# Patient Record
Sex: Female | Born: 1975 | Race: White | Hispanic: No | Marital: Married | Smoking: Current some day smoker
Health system: Southern US, Community
[De-identification: ages and names within clinical notes are randomized; demographics above are authoritative.]

## PROBLEM LIST (undated history)

## (undated) DIAGNOSIS — Z720 Tobacco use: Secondary | ICD-10-CM

## (undated) DIAGNOSIS — E669 Obesity, unspecified: Secondary | ICD-10-CM

## (undated) DIAGNOSIS — K589 Irritable bowel syndrome without diarrhea: Secondary | ICD-10-CM

## (undated) DIAGNOSIS — I10 Essential (primary) hypertension: Secondary | ICD-10-CM

## (undated) DIAGNOSIS — I214 Non-ST elevation (NSTEMI) myocardial infarction: Secondary | ICD-10-CM

## (undated) DIAGNOSIS — I5181 Takotsubo syndrome: Secondary | ICD-10-CM

## (undated) DIAGNOSIS — R519 Headache, unspecified: Secondary | ICD-10-CM

## (undated) DIAGNOSIS — N920 Excessive and frequent menstruation with regular cycle: Secondary | ICD-10-CM

## (undated) DIAGNOSIS — R51 Headache: Secondary | ICD-10-CM

## (undated) DIAGNOSIS — N946 Dysmenorrhea, unspecified: Secondary | ICD-10-CM

## (undated) HISTORY — DX: Takotsubo syndrome: I51.81

## (undated) HISTORY — DX: Excessive and frequent menstruation with regular cycle: N92.0

## (undated) HISTORY — PX: TUBAL LIGATION: SHX77

## (undated) HISTORY — PX: ABDOMINAL HYSTERECTOMY: SHX81

## (undated) HISTORY — DX: Irritable bowel syndrome, unspecified: K58.9

## (undated) HISTORY — DX: Dysmenorrhea, unspecified: N94.6

## (undated) HISTORY — DX: Obesity, unspecified: E66.9

---

## 2004-12-29 ENCOUNTER — Emergency Department: Payer: Self-pay | Admitting: Emergency Medicine

## 2006-05-31 ENCOUNTER — Emergency Department: Payer: Self-pay

## 2006-07-15 ENCOUNTER — Emergency Department: Payer: Self-pay | Admitting: Emergency Medicine

## 2007-10-26 ENCOUNTER — Emergency Department: Payer: Self-pay | Admitting: Emergency Medicine

## 2009-01-15 ENCOUNTER — Emergency Department: Payer: Self-pay | Admitting: Emergency Medicine

## 2010-01-29 ENCOUNTER — Ambulatory Visit: Payer: Self-pay | Admitting: Family

## 2010-02-08 ENCOUNTER — Emergency Department: Payer: Self-pay | Admitting: Emergency Medicine

## 2011-11-22 ENCOUNTER — Emergency Department: Payer: Self-pay | Admitting: Emergency Medicine

## 2012-05-14 ENCOUNTER — Emergency Department: Payer: Self-pay | Admitting: Emergency Medicine

## 2013-12-12 ENCOUNTER — Emergency Department: Payer: Self-pay | Admitting: Emergency Medicine

## 2013-12-12 LAB — BASIC METABOLIC PANEL
Anion Gap: 8 (ref 7–16)
BUN: 6 mg/dL — ABNORMAL LOW (ref 7–18)
CHLORIDE: 104 mmol/L (ref 98–107)
CREATININE: 0.87 mg/dL (ref 0.60–1.30)
Calcium, Total: 8.2 mg/dL — ABNORMAL LOW (ref 8.5–10.1)
Co2: 24 mmol/L (ref 21–32)
EGFR (African American): >60
GLUCOSE: 127 mg/dL — AB (ref 65–99)
Osmolality: 271 (ref 275–301)
Potassium: 3.9 mmol/L (ref 3.5–5.1)
SODIUM: 136 mmol/L (ref 136–145)

## 2013-12-12 LAB — CBC
HCT: 40.3 % (ref 35.0–47.0)
HGB: 13.7 g/dL (ref 12.0–16.0)
MCH: 31.3 pg (ref 26.0–34.0)
MCHC: 33.9 g/dL (ref 32.0–36.0)
MCV: 92 fL (ref 80–100)
PLATELETS: 234 10*3/uL (ref 150–440)
RBC: 4.37 10*6/uL (ref 3.80–5.20)
RDW: 13.2 % (ref 11.5–14.5)
WBC: 8.2 10*3/uL (ref 3.6–11.0)

## 2013-12-12 LAB — TROPONIN I: Troponin-I: <0.02 ng/mL

## 2013-12-13 LAB — TROPONIN I: Troponin-I: <0.02 ng/mL

## 2014-10-01 ENCOUNTER — Emergency Department
Admission: EM | Admit: 2014-10-01 | Discharge: 2014-10-01 | Discharge status: Against Medical Advice | Payer: Medicaid Other | Attending: Emergency Medicine | Admitting: Emergency Medicine

## 2014-10-01 DIAGNOSIS — Y9289 Other specified places as the place of occurrence of the external cause: Secondary | ICD-10-CM | POA: Insufficient documentation

## 2014-10-01 DIAGNOSIS — Y9389 Activity, other specified: Secondary | ICD-10-CM | POA: Diagnosis not present

## 2014-10-01 DIAGNOSIS — X58XXXA Exposure to other specified factors, initial encounter: Secondary | ICD-10-CM | POA: Insufficient documentation

## 2014-10-01 DIAGNOSIS — S6992XA Unspecified injury of left wrist, hand and finger(s), initial encounter: Secondary | ICD-10-CM | POA: Insufficient documentation

## 2014-10-01 DIAGNOSIS — Y998 Other external cause status: Secondary | ICD-10-CM | POA: Insufficient documentation

## 2016-01-06 ENCOUNTER — Encounter (HOSPITAL_COMMUNITY)
Admission: EM | Disposition: A | Discharge status: Home / Self Care | Payer: Self-pay | Source: Home / Self Care | Attending: Cardiology

## 2016-01-06 ENCOUNTER — Inpatient Hospital Stay (HOSPITAL_COMMUNITY)
Admission: EM | Admit: 2016-01-06 | Discharge: 2016-01-08 | DRG: 287 | Disposition: A | Discharge status: Home / Self Care | Payer: Medicaid Other | Attending: Cardiology | Admitting: Cardiology

## 2016-01-06 ENCOUNTER — Encounter (HOSPITAL_COMMUNITY): Payer: Self-pay | Admitting: Emergency Medicine

## 2016-01-06 ENCOUNTER — Emergency Department (HOSPITAL_COMMUNITY): Payer: Self-pay

## 2016-01-06 DIAGNOSIS — R778 Other specified abnormalities of plasma proteins: Secondary | ICD-10-CM | POA: Diagnosis present

## 2016-01-06 DIAGNOSIS — E785 Hyperlipidemia, unspecified: Secondary | ICD-10-CM | POA: Diagnosis present

## 2016-01-06 DIAGNOSIS — I5181 Takotsubo syndrome: Principal | ICD-10-CM

## 2016-01-06 DIAGNOSIS — I2510 Atherosclerotic heart disease of native coronary artery without angina pectoris: Secondary | ICD-10-CM | POA: Diagnosis present

## 2016-01-06 DIAGNOSIS — Z720 Tobacco use: Secondary | ICD-10-CM | POA: Diagnosis present

## 2016-01-06 DIAGNOSIS — Z8249 Family history of ischemic heart disease and other diseases of the circulatory system: Secondary | ICD-10-CM

## 2016-01-06 DIAGNOSIS — I959 Hypotension, unspecified: Secondary | ICD-10-CM | POA: Diagnosis present

## 2016-01-06 DIAGNOSIS — I248 Other forms of acute ischemic heart disease: Secondary | ICD-10-CM | POA: Diagnosis present

## 2016-01-06 DIAGNOSIS — I2489 Other forms of acute ischemic heart disease: Secondary | ICD-10-CM | POA: Diagnosis present

## 2016-01-06 DIAGNOSIS — I214 Non-ST elevation (NSTEMI) myocardial infarction: Secondary | ICD-10-CM

## 2016-01-06 DIAGNOSIS — R7989 Other specified abnormal findings of blood chemistry: Secondary | ICD-10-CM | POA: Diagnosis present

## 2016-01-06 DIAGNOSIS — F17210 Nicotine dependence, cigarettes, uncomplicated: Secondary | ICD-10-CM | POA: Diagnosis present

## 2016-01-06 HISTORY — DX: Non-ST elevation (NSTEMI) myocardial infarction: I21.4

## 2016-01-06 HISTORY — DX: Headache, unspecified: R51.9

## 2016-01-06 HISTORY — DX: Tobacco use: Z72.0

## 2016-01-06 HISTORY — PX: CARDIAC CATHETERIZATION: SHX172

## 2016-01-06 HISTORY — DX: Headache: R51

## 2016-01-06 LAB — BASIC METABOLIC PANEL
ANION GAP: 7 (ref 5–15)
BUN: 8 mg/dL (ref 6–20)
CHLORIDE: 105 mmol/L (ref 101–111)
CO2: 25 mmol/L (ref 22–32)
Calcium: 8.6 mg/dL — ABNORMAL LOW (ref 8.9–10.3)
Creatinine, Ser: 0.75 mg/dL (ref 0.44–1.00)
GFR calc Af Amer: >60 mL/min (ref >60)
GLUCOSE: 100 mg/dL — AB (ref 65–99)
POTASSIUM: 3.9 mmol/L (ref 3.5–5.1)
SODIUM: 137 mmol/L (ref 135–145)

## 2016-01-06 LAB — CBC
HEMATOCRIT: 38.7 % (ref 36.0–46.0)
HEMOGLOBIN: 13 g/dL (ref 12.0–15.0)
MCH: 31.2 pg (ref 26.0–34.0)
MCHC: 33.6 g/dL (ref 30.0–36.0)
MCV: 92.8 fL (ref 78.0–100.0)
Platelets: 228 10*3/uL (ref 150–400)
RBC: 4.17 MIL/uL (ref 3.87–5.11)
RDW: 13.5 % (ref 11.5–15.5)
WBC: 8.2 10*3/uL (ref 4.0–10.5)

## 2016-01-06 LAB — I-STAT TROPONIN, ED: Troponin i, poc: 0.27 ng/mL (ref 0.00–0.08)

## 2016-01-06 LAB — PROTIME-INR
INR: 1.13
Prothrombin Time: 14.5 seconds (ref 11.4–15.2)

## 2016-01-06 LAB — TROPONIN I: Troponin I: 0.89 ng/mL (ref <0.03)

## 2016-01-06 LAB — PREGNANCY, URINE: PREG TEST UR: NEGATIVE

## 2016-01-06 SURGERY — LEFT HEART CATH AND CORONARY ANGIOGRAPHY

## 2016-01-06 MED ORDER — SODIUM CHLORIDE 0.9 % IV SOLN
INTRAVENOUS | Status: AC
  Administered 2016-01-06: 18:00:00 via INTRAVENOUS

## 2016-01-06 MED ORDER — SODIUM CHLORIDE 0.9% FLUSH: 3.0000 mL | INTRAVENOUS | Status: DC | PRN

## 2016-01-06 MED ORDER — HEPARIN (PORCINE) IN NACL 100-0.45 UNIT/ML-% IJ SOLN: 12.0000 [IU]/kg/h | INTRAMUSCULAR | Status: DC

## 2016-01-06 MED ORDER — VERAPAMIL HCL 2.5 MG/ML IV SOLN
INTRAVENOUS | Status: DC | PRN
  Administered 2016-01-06: 10 mL via INTRA_ARTERIAL

## 2016-01-06 MED ORDER — VERAPAMIL HCL 2.5 MG/ML IV SOLN
INTRAVENOUS | Status: AC
  Filled 2016-01-06: qty 2

## 2016-01-06 MED ORDER — NITROGLYCERIN 0.4 MG SL SUBL: 0.4000 mg | SUBLINGUAL_TABLET | SUBLINGUAL | Status: DC | PRN

## 2016-01-06 MED ORDER — MIDAZOLAM HCL 2 MG/2ML IJ SOLN
INTRAMUSCULAR | Status: AC
  Filled 2016-01-06: qty 2

## 2016-01-06 MED ORDER — ASPIRIN EC 81 MG PO TBEC
81.0000 mg | DELAYED_RELEASE_TABLET | Freq: Every day | ORAL | Status: DC
  Administered 2016-01-07 – 2016-01-08 (×2): 81 mg via ORAL
  Filled 2016-01-06 (×2): qty 1

## 2016-01-06 MED ORDER — LISINOPRIL 5 MG PO TABS
5.0000 mg | ORAL_TABLET | Freq: Every day | ORAL | Status: DC
  Administered 2016-01-06 – 2016-01-07 (×2): 5 mg via ORAL
  Filled 2016-01-06 (×2): qty 1

## 2016-01-06 MED ORDER — LIDOCAINE HCL (PF) 1 % IJ SOLN
INTRAMUSCULAR | Status: DC | PRN
  Administered 2016-01-06: 3 mL

## 2016-01-06 MED ORDER — HEPARIN SODIUM (PORCINE) 5000 UNIT/ML IJ SOLN
4000.0000 [IU] | Freq: Once | INTRAMUSCULAR | Status: AC
  Administered 2016-01-06: 4000 [IU] via INTRAVENOUS

## 2016-01-06 MED ORDER — OXYCODONE-ACETAMINOPHEN 5-325 MG PO TABS
1.0000 | ORAL_TABLET | ORAL | Status: DC | PRN
  Administered 2016-01-06: 18:00:00 1 via ORAL
  Filled 2016-01-06: qty 1

## 2016-01-06 MED ORDER — ATORVASTATIN CALCIUM 80 MG PO TABS
80.0000 mg | ORAL_TABLET | Freq: Every day | ORAL | Status: DC
  Administered 2016-01-06: 19:00:00 80 mg via ORAL

## 2016-01-06 MED ORDER — HEPARIN (PORCINE) IN NACL 2-0.9 UNIT/ML-% IJ SOLN
INTRAMUSCULAR | Status: AC
  Filled 2016-01-06: qty 1000

## 2016-01-06 MED ORDER — CARVEDILOL 3.125 MG PO TABS
3.1250 mg | ORAL_TABLET | Freq: Two times a day (BID) | ORAL | Status: DC
  Administered 2016-01-06 – 2016-01-07 (×3): 3.125 mg via ORAL
  Filled 2016-01-06 (×3): qty 1

## 2016-01-06 MED ORDER — IOPAMIDOL (ISOVUE-370) INJECTION 76%
INTRAVENOUS | Status: DC | PRN
  Administered 2016-01-06: 80 mL

## 2016-01-06 MED ORDER — HEPARIN (PORCINE) IN NACL 2-0.9 UNIT/ML-% IJ SOLN
INTRAMUSCULAR | Status: DC | PRN
  Administered 2016-01-06: 1000 mL

## 2016-01-06 MED ORDER — SODIUM CHLORIDE 0.9% FLUSH
3.0000 mL | Freq: Two times a day (BID) | INTRAVENOUS | Status: DC
  Administered 2016-01-07 (×2): 3 mL via INTRAVENOUS

## 2016-01-06 MED ORDER — LIDOCAINE HCL (PF) 1 % IJ SOLN
INTRAMUSCULAR | Status: AC
  Filled 2016-01-06: qty 30

## 2016-01-06 MED ORDER — IOPAMIDOL (ISOVUE-370) INJECTION 76%
INTRAVENOUS | Status: AC
  Filled 2016-01-06: qty 100

## 2016-01-06 MED ORDER — HEPARIN (PORCINE) IN NACL 100-0.45 UNIT/ML-% IJ SOLN
900.0000 [IU]/h | INTRAMUSCULAR | Status: DC
  Administered 2016-01-06 (×2): 900 [IU]/h via INTRAVENOUS
  Filled 2016-01-06: qty 250

## 2016-01-06 MED ORDER — FENTANYL CITRATE (PF) 100 MCG/2ML IJ SOLN
INTRAMUSCULAR | Status: DC | PRN
  Administered 2016-01-06: 50 ug via INTRAVENOUS

## 2016-01-06 MED ORDER — HEPARIN SODIUM (PORCINE) 1000 UNIT/ML IJ SOLN
INTRAMUSCULAR | Status: DC | PRN
  Administered 2016-01-06: 3500 [IU] via INTRAVENOUS

## 2016-01-06 MED ORDER — SODIUM CHLORIDE 0.9 % IV SOLN: 250.0000 mL | INTRAVENOUS | Status: DC | PRN

## 2016-01-06 MED ORDER — ACETAMINOPHEN 325 MG PO TABS
650.0000 mg | ORAL_TABLET | ORAL | Status: DC | PRN
  Administered 2016-01-07 (×2): 650 mg via ORAL
  Filled 2016-01-06 (×2): qty 2

## 2016-01-06 MED ORDER — FENTANYL CITRATE (PF) 100 MCG/2ML IJ SOLN
INTRAMUSCULAR | Status: AC
  Filled 2016-01-06: qty 2

## 2016-01-06 MED ORDER — MIDAZOLAM HCL 2 MG/2ML IJ SOLN
INTRAMUSCULAR | Status: DC | PRN
  Administered 2016-01-06: 2 mg via INTRAVENOUS

## 2016-01-06 MED ORDER — ONDANSETRON HCL 4 MG/2ML IJ SOLN
4.0000 mg | Freq: Four times a day (QID) | INTRAMUSCULAR | Status: DC | PRN
  Filled 2016-01-06: qty 2

## 2016-01-06 SURGICAL SUPPLY — 11 items
CATH INFINITI 5 FR JL3.5 (CATHETERS) ×2 IMPLANT
CATH INFINITI 5FR MULTPACK ANG (CATHETERS) ×2 IMPLANT
DEVICE RAD COMP TR BAND LRG (VASCULAR PRODUCTS) ×2 IMPLANT
GLIDESHEATH SLEND SS 6F .021 (SHEATH) ×2 IMPLANT
KIT HEART LEFT (KITS) ×2 IMPLANT
PACK CARDIAC CATHETERIZATION (CUSTOM PROCEDURE TRAY) ×2 IMPLANT
SYR MEDRAD MARK V 150ML (SYRINGE) ×2 IMPLANT
TRANSDUCER W/STOPCOCK (MISCELLANEOUS) ×2 IMPLANT
TUBING CIL FLEX 10 FLL-RA (TUBING) ×2 IMPLANT
WIRE HI TORQ VERSACORE-J 145CM (WIRE) ×2 IMPLANT
WIRE SAFE-T 1.5MM-J .035X260CM (WIRE) ×2 IMPLANT

## 2016-01-06 NOTE — ED Provider Notes (Signed)
Emergency Department Provider Note   I have reviewed the triage vital signs and the nursing notes.   HISTORY  Chief Complaint Chest Pain   HPI Sarah Mills is a 40 y.o. female with no known past medical history presents to the emergency department for evaluation of sudden onset chest pressure radiating to both arms. Patient states she was at work and doing her normal activities when the symptoms began. She felt pressure and became diaphoretic. The pressure persisted and EMS was called. She was given aspirin and nitroglycerin which improved her pain somewhat. She notes complete resolution of chest discomfort with morphine administration in route to the emergency department. She has no history of heart attack or similar symptoms. Patient states she had a work physical urine ago and does not have any known history of high blood pressure, high cholesterol, diabetes. She does smoke cigarettes. She does have a family history (Dad) of CAD.   Past Medical History:  Diagnosis Date  . Tobacco abuse     Patient Active Problem List   Diagnosis Date Noted  . NSTEMI (non-ST elevated myocardial infarction) ([place]) 01/06/2016  . Tobacco use 01/06/2016  . Elevated troponin   . Non-ischemic cardiomyopathy ([place])     History reviewed. No pertinent surgical history.    Allergies Review of patient's allergies indicates no known allergies.  Family History  Problem Relation Age of Onset  . CAD Father   . CAD Maternal Grandfather     Social History Social History  Substance Use Topics  . Smoking status: Current Every Day Smoker    Packs/day: 0.50    Years: 28.00    Types: Cigarettes  . Smokeless tobacco: Never Used  . Alcohol use No    Review of Systems  Constitutional: No fever/chills Eyes: No visual changes. ENT: No sore throat. Cardiovascular: Positive chest pain radiating to both arms. Positive diaphoresis.  Respiratory: Denies shortness of breath. Gastrointestinal: No  abdominal pain.  No nausea, no vomiting.  No diarrhea.  No constipation. Genitourinary: Negative for dysuria. Musculoskeletal: Negative for back pain. Skin: Negative for rash. Neurological: Negative for headaches, focal weakness or numbness.  10-point ROS otherwise negative.  ____________________________________________   PHYSICAL EXAM:  VITAL SIGNS: ED Triage Vitals  Enc Vitals Group     BP 01/06/16 1042 127/77     Pulse Rate 01/06/16 1042 71     Resp 01/06/16 1042 13     Temp 01/06/16 1042 97.8 F (36.6 C)     Temp Source 01/06/16 1042 Oral     SpO2 01/06/16 1039 99 %     Weight 01/06/16 1042 150 lb (68 kg)     Height 01/06/16 1042 5\' 9"  (1.753 m)     Pain Score 01/06/16 1042 5    Constitutional: Alert and oriented. Well appearing and in no acute distress. Eyes: Conjunctivae are normal. Head: Atraumatic. Nose: No congestion/rhinnorhea. Mouth/Throat: Mucous membranes are moist.  Oropharynx non-erythematous. Neck: No stridor.   Cardiovascular: Normal rate, regular rhythm. Good peripheral circulation. Grossly normal heart sounds.   Respiratory: Normal respiratory effort.  No retractions. Lungs CTAB. Gastrointestinal: Soft and nontender. No distention.  Musculoskeletal: No lower extremity tenderness nor edema. No gross deformities of extremities. Neurologic:  Normal speech and language. No gross focal neurologic deficits are appreciated.  Skin:  Skin is warm, dry and intact. No rash noted. Psychiatric: Mood and affect are normal. Speech and behavior are normal.  ____________________________________________   LABS (all labs ordered are listed, but only abnormal  results are displayed)  Labs Reviewed  BASIC METABOLIC PANEL - Abnormal; Notable for the following:       Result Value   Glucose, Bld 100 (*)    Calcium 8.6 (*)    All other components within normal limits  I-STAT TROPOININ, ED - Abnormal; Notable for the following:    Troponin i, poc 0.27 (*)    All other  components within normal limits  CBC  PROTIME-INR  PREGNANCY, URINE  HEPARIN LEVEL (UNFRACTIONATED)  CBC   ____________________________________________  EKG   EKG Interpretation  Date/Time:  Monday January 06 2016 10:37:55 EDT Ventricular Rate:  73 PR Interval:    QRS Duration: 93 QT Interval:  413 QTC Calculation: 456 R Axis:   -7 Text Interpretation:  Sinus rhythm Borderline T abnormalities, diffuse leads No STEMI.  Confirmed by Joelie Schou MD, Suella Cogar [phone]) on 01/06/2016 11:00:58 AM      ____________________________________________  RADIOLOGY  Dg Chest 2 View  Result Date: 01/06/2016 CLINICAL DATA:  Chest pain. EXAM: CHEST  2 VIEW COMPARISON:  None. FINDINGS: The heart size and mediastinal contours are within normal limits. Both lungs are clear. The visualized skeletal structures are unremarkable. IMPRESSION: No active cardiopulmonary disease. Electronically Signed   By: Rolm Baptise M.D.   On: 01/06/2016 11:05    ____________________________________________   PROCEDURES  Procedure(s) performed:   Procedures  None ____________________________________________   INITIAL IMPRESSION / ASSESSMENT AND PLAN / ED COURSE  Pertinent labs & imaging results that were available during my care of the patient were reviewed by me and considered in my medical decision making (see chart for details).  Patient resents emergency pertinent for evaluation of chest pressure, diaphoresis, arm discomfort. Pain is completely resolved with nitroglycerin and morphine given by EMS. Patient has been given full dose aspirin. I have reviewed the patient's EKG with no acute findings. HEART score of 3 (low risk) with highly suspicious story and 1-2 risk factors (family history and smoking). Low suspicion for pulmonary embolism clinically. Plan for biomarkers and reassessment.  12:11 PM Made aware of troponin elevation 0.27. Will begin heparin and consult cardiology. Patient remains chest pain-free.  The patient and family regarding the lab results and that she would need to be admitted for close monitoring and possible heart catheterization.   Cardiology has seen and placed orders.  ____________________________________________  FINAL CLINICAL IMPRESSION(S) / ED DIAGNOSES  Final diagnoses:  NSTEMI (non-ST elevated myocardial infarction) ([place])     MEDICATIONS GIVEN DURING THIS VISIT:  Medications  heparin ADULT infusion 100 units/mL (25000 units/252mL sodium chloride 0.45%) (900 Units/hr Intravenous New Bag/Given 01/06/16 1231)  heparin injection 4,000 Units (4,000 Units Intravenous Given 01/06/16 1231)     NEW OUTPATIENT MEDICATIONS STARTED DURING THIS VISIT:  None   Note:  This document was prepared using Dragon voice recognition software and may include unintentional dictation errors.  Nanda Quinton, MD Emergency Medicine  Margette Fast, MD 01/06/16 [id number]

## 2016-01-06 NOTE — H&P (Signed)
History & Physical    Patient ID: Sarah Mills MRN: [id number], DOB/AGE: 11-14-1975   Admit date: 01/06/2016  Primary Physician: No primary care provider on file. Primary Cardiologist: New - Dr. Martinique  History of Present Illness    Sarah Mills is a 40 y.o. female with past medical history of tobacco use and no prior cardiac history who presents to [facility] ED on 01/06/2016 for evaluation of chest pain.  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 Says the pain radiated to her shoulders bilaterally and was associated with dyspnea and diaphoresis. She walked outside to get some fresh air but says the pain worsened.  EMS was called and she was given SL NTG and IV Morphine with complete resolution of her symptoms. Denies any symptoms at this current time.   She denies any prior cardiac history. No history of HTN, HLD, or Type 2 DM. Family history of CAD with father having CABG in his 58's.  Has smoked 0.5 ppd since age 31. Denies any alcohol use or recreational drug use.   While in the ED, labs show a WBC of 8.2, Hgb 13.0, platelets 228. K+ 3.9. Creatinine 0.75. Initial troponin elevated to 0.27. CXR with no active cardiopulmonary disease. EKG shows NSR, HR 73, with TWI in leads III and V3-V5 (TWI in V3-V5 is new when compared to prior tracing).   Past Medical History    Past Medical History:  Diagnosis Date  . Tobacco abuse     History reviewed. No pertinent surgical history.   Allergies  No Known Allergies   Home Medications    Prior to Admission medications   Medication Sig Start Date End Date Taking? Authorizing Provider  acetaminophen (TYLENOL) 500 MG tablet Take 1,000 mg by mouth every 6 (six) hours as needed.   Yes Historical Provider, MD  ibuprofen (ADVIL,MOTRIN) 200 MG tablet Take 800 mg by mouth every 6 (six) hours as needed.   Yes Historical Provider, MD    Family  History    Family History  Problem Relation Age of Onset  . CAD Father   . CAD Maternal Grandfather     Social History    Social History   Social History  . Marital status: Single    Spouse name: N/A  . Number of children: N/A  . Years of education: N/A   Occupational History  . Not on file.   Social History Main Topics  . Smoking status: Current Every Day Smoker    Packs/day: 0.50    Years: 28.00    Types: Cigarettes  . Smokeless tobacco: Never Used  . Alcohol use No  . Drug use: No  . Sexual activity: Not on file   Other Topics Concern  . Not on file   Social History Narrative  . No narrative on file     Review of Systems    General:  No chills, fever, night sweats or weight changes.  Cardiovascular:  No edema, orthopnea, palpitations, paroxysmal nocturnal dyspnea. Positive for chest pain, dyspnea, and diaphoresis.  Dermatological: No rash, lesions/masses Respiratory: No cough, Positive for dyspnea Urologic: No hematuria, dysuria Abdominal:   No nausea, vomiting, diarrhea, bright red blood per rectum, melena, or hematemesis Neurologic:  No visual changes, wkns, changes in mental status. All other systems reviewed and are otherwise negative except as noted above.  Physical Exam    Blood pressure 99/75, pulse  66, temperature 97.8 F (36.6 C), temperature source Oral, resp. rate 16, height 5\' 9"  (1.753 m), weight 150 lb (68 kg), last menstrual period 01/04/2016, SpO2 100 %.  General: Well developed, well nourished,female appearing in no acute distress. Head: Normocephalic, atraumatic, sclera non-icteric, no xanthomas, nares are without discharge. Dentition:  Neck: No carotid bruits. JVD not elevated.  Lungs: Respirations regular and unlabored, without wheezes or rales.  Heart: Regular rate and rhythm. No S3 or S4.  No murmur, no rubs, or gallops appreciated. Abdomen: Soft, non-tender, non-distended with normoactive bowel sounds. No hepatomegaly. No  rebound/guarding. No obvious abdominal masses. Msk:  Strength and tone appear normal for age. No joint deformities or effusions. Extremities: No clubbing or cyanosis. No edema.  Distal pedal pulses are 2+ bilaterally. Neuro: Alert and oriented X 3. Moves all extremities spontaneously. No focal deficits noted. Psych:  Responds to questions appropriately with a normal affect. Skin: No rashes or lesions noted  Labs    Troponin (Point of Care Test)  Recent Labs  01/06/16 1156  TROPIPOC 0.27*   No results for input(s): CKTOTAL, CKMB, TROPONINI in the last 72 hours. Lab Results  Component Value Date   WBC 8.2 01/06/2016   HGB 13.0 01/06/2016   HCT 38.7 01/06/2016   MCV 92.8 01/06/2016   PLT 228 01/06/2016     Recent Labs Lab 01/06/16 1142  NA 137  K 3.9  CL 105  CO2 25  BUN 8  CREATININE 0.75  CALCIUM 8.6*  GLUCOSE 100*   No results found for: CHOL, HDL, LDLCALC, TRIG No results found for: DDIMER  No results found for: BNP No results found for: PROBNP No results for input(s): INR in the last 72 hours.    Radiology Studies    Dg Chest 2 View  Result Date: 01/06/2016 CLINICAL DATA:  Chest pain. EXAM: CHEST  2 VIEW COMPARISON:  None. FINDINGS: The heart size and mediastinal contours are within normal limits. Both lungs are clear. The visualized skeletal structures are unremarkable. IMPRESSION: No active cardiopulmonary disease. Electronically Signed   By: Rolm Baptise M.D.   On: 01/06/2016 11:05    EKG & Cardiac Imaging    EKG: NSR, HR 73, with TWI in leads III and V3-V5 (TWI in V3-V5 is new when compared to prior tracing).   ECHOCARDIOGRAM: None on File  Assessment & Plan    1. NSTEMI (non-ST elevated myocardial infarction) ([place]) - presents with sudden onset chest pressure this AM, radiating into her shoulders bilaterally, and associated with dyspnea and diaphoresis. Denies any prior cardiac history. Pain relieved with SL NTG and IV Morphine. - No history of  HTN, HLD, or Type 2 DM. Family history of CAD with father having CABG in his 64's and she has over a 52 pack-year history.  - Initial troponin elevated to 0.27. Check Lipid Panel and Hgb A1c for risk stratification. EKG shows NSR, HR 73, with TWI in V3-V5 (TWI in V3-V5 is new when compared to prior tracing).  - started on Heparin. Start on high-intensity statin. Mildly hypotensive with SBP in 90's currently. Initiate low-dose BB if blood pressure allows.  - With presenting symptoms and elevated troponin a definitive cardiac catheterization is recommended. The patient understands that risks included but are not limited to stroke (1 in 1000), death (1 in 79), kidney failure [usually temporary] (1 in 500), bleeding (1 in 200), allergic reaction [possibly serious] (1 in 200). Will place on the board for today.   2. Tobacco use -  has over 14 pack-year history - cessation advised.   Signed, Erma Heritage, PA-C 01/06/2016, 2:11 PM Pager: [phone] Patient seen and examined and history reviewed. Agree with above findings and plan. Pleasant 40 yo WF with history of tobacco abuse and family history of CAD presents for evaluation of chest pain. She had sudden onset of mid sternal chest pain radiating across her chest and into her arms. It felt like someone was sitting on her chest. She broke out in a sweat. Pain lasted about 30 minutes before EMS arrived and was relieved with sl Ntg. Now pain free.  Exam is unremarkable. Normotensive. Lungs clear. No gallop, murmur, or rub. No edema. Ecg shows new T wave inversion in leads V3-5 compared to 2015. Troponin is mildly elevated.   Impression: NSTEMI. Differential in this young female includes ACS with ruptured plaque, Spontaneous coronary dissection, Stress induced cardiomyopathy, or coronary vasospasm. Now on ASA, IV heparin, beta blocker. Will arrange for cardiac cath with possible PCI this afternoon. The procedure and risks were reviewed including but  not limited to death, myocardial infarction, stroke, arrythmias, bleeding, transfusion, emergency surgery, dye allergy, or renal dysfunction. The patient voices understanding and is agreeable to proceed.   Peter Martinique, [place] 01/06/2016 2:39 PM

## 2016-01-06 NOTE — Interval H&P Note (Signed)
History and Physical Interval Note:  01/06/2016 4:23 PM  Sarah Mills  has presented today for cardiac cath with the diagnosis of NSTEMI. The various methods of treatment have been discussed with the patient and family. After consideration of risks, benefits and other options for treatment, the patient has consented to  Procedure(s): Left Heart Cath and Coronary Angiography (N/A) as a surgical intervention .  The patient's history has been reviewed, patient examined, no change in status, stable for surgery.  I have reviewed the patient's chart and labs.  Questions were answered to the patient's satisfaction.    Cath Lab Visit (complete for each Cath Lab visit)  Clinical Evaluation Leading to the Procedure:   ACS: Yes.    Non-ACS:    Anginal Classification: CCS III  Anti-ischemic medical therapy: No Therapy  Non-Invasive Test Results: No non-invasive testing performed  Prior CABG: No previous CABG         Lauree Chandler

## 2016-01-06 NOTE — Progress Notes (Signed)
ANTICOAGULATION CONSULT NOTE - Initial Consult  Pharmacy Consult for heparin Indication: chest pain/ACS  No Known Allergies  Patient Measurements: Height: 5\' 9"  (175.3 cm) Weight: 150 lb (68 kg) IBW/kg (Calculated) : 66.2 Heparin Dosing Weight: 68kg  Vital Signs: Temp: 97.8 F (36.6 C) (08/28 1042) Temp Source: Oral (08/28 1042) BP: 117/85 (08/28 1200) Pulse Rate: 70 (08/28 1200)  Labs:  Recent Labs  01/06/16 1142  HGB 13.0  HCT 38.7  PLT 228    CrCl cannot be calculated (Patient's most recent lab result is older than the maximum 21 days allowed.).   Assessment: 72 YOF here with chest pressure and diaphoresis and shortness of breath. To start heparin for ACS/STEMI.  Not on anticoagulation PTA. Baseline labs as above.   Goal of Therapy:  Heparin level 0.3-0.7 units/ml Monitor platelets by anticoagulation protocol: Yes   Plan:  -heparin 4000 units IV x1, then start infusion at 900 units/hr -heparin level in 6h -daily heparin level and CBC -follow for s/s bleeding and cardiology plans  Jaylan Hinojosa D. Shayna Eblen, PharmD, BCPS Clinical Pharmacist Pager: [phone] 01/06/2016 12:17 PM

## 2016-01-06 NOTE — ED Triage Notes (Signed)
Pt arrives via [facility] from work, ems reports pt began to have central chest pressure that radiated to both arms, pt was diaphoretic and sob on ems arrival. Pt received 324mg  asa, 4mg  morphine and 2 sl nitro that brought pain from 9/10 to 1/10. Pt a/ox4, resp e/u, skin warm and dry, vss.

## 2016-01-06 NOTE — ED Notes (Signed)
Attempted report 

## 2016-01-07 ENCOUNTER — Encounter (HOSPITAL_COMMUNITY): Payer: Self-pay | Admitting: Cardiovascular Disease

## 2016-01-07 DIAGNOSIS — I248 Other forms of acute ischemic heart disease: Secondary | ICD-10-CM | POA: Diagnosis present

## 2016-01-07 DIAGNOSIS — I5181 Takotsubo syndrome: Principal | ICD-10-CM

## 2016-01-07 LAB — LIPID PANEL
CHOLESTEROL: 150 mg/dL (ref 0–200)
HDL: 32 mg/dL — AB (ref >40)
LDL CALC: 99 mg/dL (ref 0–99)
TRIGLYCERIDES: 96 mg/dL (ref <150)
Total CHOL/HDL Ratio: 4.7 RATIO
VLDL: 19 mg/dL (ref 0–40)

## 2016-01-07 LAB — CBC
HCT: 39.4 % (ref 36.0–46.0)
Hemoglobin: 13 g/dL (ref 12.0–15.0)
MCH: 31 pg (ref 26.0–34.0)
MCHC: 33 g/dL (ref 30.0–36.0)
MCV: 94 fL (ref 78.0–100.0)
PLATELETS: 225 10*3/uL (ref 150–400)
RBC: 4.19 MIL/uL (ref 3.87–5.11)
RDW: 13.7 % (ref 11.5–15.5)
WBC: 6.3 10*3/uL (ref 4.0–10.5)

## 2016-01-07 LAB — BASIC METABOLIC PANEL
Anion gap: 6 (ref 5–15)
BUN: 6 mg/dL (ref 6–20)
CHLORIDE: 104 mmol/L (ref 101–111)
CO2: 27 mmol/L (ref 22–32)
Calcium: 8.9 mg/dL (ref 8.9–10.3)
Creatinine, Ser: 0.75 mg/dL (ref 0.44–1.00)
GFR calc non Af Amer: >60 mL/min (ref >60)
Glucose, Bld: 98 mg/dL (ref 65–99)
Potassium: 4.2 mmol/L (ref 3.5–5.1)
SODIUM: 137 mmol/L (ref 135–145)

## 2016-01-07 LAB — TROPONIN I
TROPONIN I: 0.28 ng/mL — AB (ref <0.03)
TROPONIN I: 0.48 ng/mL — AB (ref <0.03)

## 2016-01-07 MED ORDER — HEART ATTACK BOUNCING BOOK
Freq: Once | Status: DC
  Filled 2016-01-07: qty 1

## 2016-01-07 MED ORDER — ATORVASTATIN CALCIUM 10 MG PO TABS
10.0000 mg | ORAL_TABLET | Freq: Every day | ORAL | Status: DC
  Administered 2016-01-07: 10 mg via ORAL
  Filled 2016-01-07: qty 1

## 2016-01-07 NOTE — Progress Notes (Signed)
Patient arrived to 2W via bed in no apparent distress.  Patient denied pain on arrival.  Telemetry monitor was applied and CCMD notified.  Patient was oriented to unit and room to include call light and telephone.  Will continue to monitor.

## 2016-01-07 NOTE — Progress Notes (Signed)
TELEMETRY: Reviewed telemetry pt in NSR: Vitals:   01/06/16 1830 01/06/16 1900 01/06/16 2030 01/07/16 0714  BP: 96/80 120/69 128/68 108/75  Pulse: 73  67 67  Resp: 20 13 13 13   Temp:   98.3 F (36.8 C) 98.3 F (36.8 C)  TempSrc:   Oral Oral  SpO2: 97%  95% 100%  Weight:    193 lb 5.5 oz (87.7 kg)  Height:        Intake/Output Summary (Last 24 hours) at 01/07/16 1004 Last data filed at 01/07/16 0850  Gross per 24 hour  Intake            732.5 ml  Output             1000 ml  Net           -267.5 ml   Filed Weights   01/06/16 1042 01/07/16 0714  Weight: 150 lb (68 kg) 193 lb 5.5 oz (87.7 kg)    Subjective  Feels much better today. No chest pain or dyspnea.   Marland Kitchen aspirin EC  81 mg Oral Daily  . atorvastatin  80 mg Oral q1800  . carvedilol  3.125 mg Oral BID WC  . heart attack bouncing book   Does not apply Once  . lisinopril  5 mg Oral Daily  . sodium chloride flush  3 mL Intravenous Q12H      LABS: Basic Metabolic Panel:  Recent Labs  01/06/16 1142 01/07/16 0609  NA 137 137  K 3.9 4.2  CL 105 104  CO2 25 27  GLUCOSE 100* 98  BUN 8 6  CREATININE 0.75 0.75  CALCIUM 8.6* 8.9   Liver Function Tests: No results for input(s): AST, ALT, ALKPHOS, BILITOT, PROT, ALBUMIN in the last 72 hours. No results for input(s): LIPASE, AMYLASE in the last 72 hours. CBC:  Recent Labs  01/06/16 1142 01/07/16 0609  WBC 8.2 6.3  HGB 13.0 13.0  HCT 38.7 39.4  MCV 92.8 94.0  PLT 228 225   Cardiac Enzymes:  Recent Labs  01/06/16 1847 01/06/16 2356 01/07/16 0609  TROPONINI 0.89* 0.48* 0.28*   BNP: No results for input(s): PROBNP in the last 72 hours. D-Dimer: No results for input(s): DDIMER in the last 72 hours. Hemoglobin A1C: No results for input(s): HGBA1C in the last 72 hours. Fasting Lipid Panel:  Recent Labs  01/07/16 0609  CHOL 150  HDL 32*  LDLCALC 99  TRIG 96  CHOLHDL 4.7   Thyroid Function Tests: No results for input(s): TSH, T4TOTAL,  T3FREE, THYROIDAB in the last 72 hours.  Invalid input(s): FREET3   Radiology/Studies:  Dg Chest 2 View  Result Date: 01/06/2016 CLINICAL DATA:  Chest pain. EXAM: CHEST  2 VIEW COMPARISON:  None. FINDINGS: The heart size and mediastinal contours are within normal limits. Both lungs are clear. The visualized skeletal structures are unremarkable. IMPRESSION: No active cardiopulmonary disease. Electronically Signed   By: Rolm Baptise M.D.   On: 01/06/2016 11:05   Ecg today shows NSR with new deep T wave inversions in the inferior and lateral leads.  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  The left ventricular ejection fraction is 25-35% by visual estimate.  There is moderate to severe left ventricular systolic dysfunction.  LV end diastolic pressure is mildly elevated.  There is no mitral valve regurgitation.   1. Mild non-obstructive CAD.  2. The LAD and large diagonal branch have no obstructive disease. The diagonal  branch is similar in caliber to the LAD. The LAD does not reach the apex. The Circumflex artery has no obstructive disease. The RCA is large and gives off a small to moderate caliber PDA branch. This branch tapers down to a small caliber vessel distally. Cannot exclude some diffuse plaque in this distal PDA branch but it is too small for PCI.  3. Moderately severe LV systolic dysfunction with segmental hypokinesis of the inferoapical, apical and anteroapical walls c/w Takotsubo's cardiomyopathy.      PHYSICAL EXAM General: Well developed, well nourished, in no acute distress. Head: Normocephalic, atraumatic, sclera non-icteric, oropharynx is clear Neck: Negative for carotid bruits. JVD not elevated. No adenopathy Lungs: Clear bilaterally to auscultation without wheezes, rales, or rhonchi. Breathing is unlabored. Heart: RRR S1 S2 without murmurs, rubs, or gallops.  Abdomen: Soft, non-tender, non-distended with normoactive bowel sounds. No  hepatomegaly. No rebound/guarding. No obvious abdominal masses. Msk:  Strength and tone appears normal for age. Extremities: No clubbing, cyanosis or edema.  Distal pedal pulses are 2+ and equal bilaterally. Neuro: Alert and oriented X 3. Moves all extremities spontaneously. Psych:  Responds to questions appropriately with a normal affect.  ASSESSMENT AND PLAN: 1. Stress induced cardiomyopathy (Takotsubo syndrome) with demand ischemia. Peak troponin 0.89. Moderate LV dysfunction by cath. Echo pending. Symptoms have resolved. On ASA, beta blocker, and ACEi. Plan to ambulate today. Check Echo. Probable DC in am if stable.  2. Tobacco abuse. Recommend cessation. 3. Mild dyslipidemia. Recommend statin but at lower dose  Present on Admission: . Tobacco use . Elevated troponin . Demand ischemia [facility])   Signed, Micah Galeno Martinique, [place] 01/07/2016 10:04 AM

## 2016-01-07 NOTE — Progress Notes (Signed)
CARDIAC REHAB PHASE I   PRE:  Rate/Rhythm: 68 SR    BP: sitting 113/77    SaO2:   MODE:  Ambulation: 1000 ft   POST:  Rate/Rhythm: 95 SR    BP: sitting 107/64     SaO2:   Tolerated well, denied SOB or CP. Ed completed. Pt overwhelmed but receptive. Discussed smoking cessation and stress reduction including ex. Not interested in CRPII (pt now documented as demand ischemia, not NSTEMI).  [id number]   [place], ACSM 01/07/2016 12:17 PM

## 2016-01-07 NOTE — Care Management Note (Addendum)
Case Management Note  Patient Details  Name: Sarah Mills MRN: [id number] Date of Birth: 01/19/1976  Subjective/Objective:   Patient is s/p cath, has no insurance will most likely need medication ast and pcp follow up.  NCM tried to make apt at [facility] clinic , was told to call back tomorrow to try and make apt, they were full.  Sickle cell clinic state they can not take patient with cardiomyopathy, but she will check and call me back. NCM will cont to follow for dc needs.                  Action/Plan:   Expected Discharge Date:                  Expected Discharge Plan:  Home/Self Care  In-House Referral:     Discharge planning Services  CM Consult, Medication Assistance, [facility]  Post Acute Care Choice:    Choice offered to:     DME Arranged:    DME Agency:     HH Arranged:    HH Agency:     Status of Service:  In process, will continue to follow  If discussed at Long Length of Stay Meetings, dates discussed:    Additional Comments:  Zenon Mayo, RN 01/07/2016, 12:26 PM

## 2016-01-08 ENCOUNTER — Inpatient Hospital Stay (HOSPITAL_COMMUNITY): Payer: Self-pay

## 2016-01-08 DIAGNOSIS — I427 Cardiomyopathy due to drug and external agent: Secondary | ICD-10-CM

## 2016-01-08 LAB — HEMOGLOBIN A1C
Hgb A1c MFr Bld: 5.1 % (ref 4.8–5.6)
MEAN PLASMA GLUCOSE: 100 mg/dL

## 2016-01-08 LAB — ECHOCARDIOGRAM COMPLETE
HEIGHTINCHES: 69 in
Weight: 3075.86 oz

## 2016-01-08 MED ORDER — CARVEDILOL 3.125 MG PO TABS: 3.1250 mg | ORAL_TABLET | Freq: Two times a day (BID) | ORAL | 5 refills | Status: DC

## 2016-01-08 MED ORDER — ASPIRIN 81 MG PO TBEC: 81.0000 mg | DELAYED_RELEASE_TABLET | Freq: Every day | ORAL | Status: DC

## 2016-01-08 MED ORDER — NITROGLYCERIN 0.4 MG SL SUBL: 0.4000 mg | SUBLINGUAL_TABLET | SUBLINGUAL | 2 refills | Status: DC | PRN

## 2016-01-08 MED ORDER — PERFLUTREN LIPID MICROSPHERE
1.0000 mL | INTRAVENOUS | Status: AC | PRN
  Administered 2016-01-08: 2 mL via INTRAVENOUS
  Filled 2016-01-08: qty 10

## 2016-01-08 MED ORDER — ATORVASTATIN CALCIUM 10 MG PO TABS: 10.0000 mg | ORAL_TABLET | Freq: Every day | ORAL | 3 refills | Status: DC

## 2016-01-08 NOTE — Progress Notes (Signed)
  Echocardiogram 2D Echocardiogram with Definity has been performed.  Sarah Mills 01/08/2016, 12:02 PM

## 2016-01-08 NOTE — Progress Notes (Signed)
EKG done and is abnl. Dr. Ron Parker on floor and review EKG. Order to hold a.m. meds till Dr. Martinique makes rounds this a.m.

## 2016-01-08 NOTE — Discharge Instructions (Signed)
Heart Attack A heart attack (myocardial infarction, MI) causes damage to the heart that cannot be fixed. A heart attack often happens when a blood clot or other blockage cuts blood flow to the heart. When this happens, certain areas of the heart begin to die. This causes the pain you feel during a heart attack. HOME CARE  Take medicine as told by your doctor. You may need medicine to:  Keep your blood from clotting too easily.  Control your blood pressure.  Lower your cholesterol.  Control abnormal heart rhythms.  Change certain behaviors as told by your doctor. This may include:  Quitting smoking.  Being active.  Eating a heart-healthy diet. Ask your doctor for help with this diet.  Keeping a healthy weight.  Keeping your diabetes under control.  Lessening stress.  Limiting how much alcohol you drink. Do not take these medicines unless your doctor says that you can:  Nonsteroidal anti-inflammatory drugs (NSAIDs). These include:  Ibuprofen.  Naproxen.  Celecoxib.  Vitamin supplements that have vitamin A, vitamin E, or both.  Hormone therapy that contains estrogen with or without progestin. GET HELP RIGHT AWAY IF:  You have sudden chest discomfort.  You have sudden discomfort in your:  Arms.  Back.  Neck.  Jaw.  You have shortness of breath at any time.  You have sudden sweating or clammy skin.  You feel sick to your stomach (nauseous) or throw up (vomit).  You suddenly get light-headed or dizzy.  You feel your heart beating fast or skipping beats. These symptoms may be an emergency. Do not wait to see if the symptoms will go away. Get medical help right away. Call your local emergency services (911 in the [place] Do not drive yourself to the hospital.   This information is not intended to replace advice given to you by your health care provider. Make sure you discuss any questions you have with your health care provider.   Document Released:  10/27/2011 Document Revised: 09/11/2014 Document Reviewed: 06/30/2013 Elsevier Interactive Patient Education [employer].

## 2016-01-08 NOTE — Care Management Note (Signed)
Case Management Note Marvetta Gibbons RN, BSN Unit 2W-Case Manager [phone]  Patient Details  Name: ROONEY HARRALSON MRN: [id number] Date of Birth: 1976/04/13  Subjective/Objective:  :Pt admitted with Takotsubo syndrome                  Action/Plan: PTA pt lived at home with family- does not have insurance or PCP- f/u appointment has been made for pt at [facility] for Sept. 6 at 2:00- pt will also need assistance with medications for discharge and is eligible for [facility]- spoke with pt/mother and daughter at bedside- [facility] letter given to pt - scripts have been sent to [facility] in [place] which is a participating pharmacy- program explained to pt along with $3 copay per script cost which pt states she can do. No other CM needs noted  Expected Discharge Date:      01/08/16            Expected Discharge Plan:  Home/Self Care  In-House Referral:     Discharge planning Services  CM Consult, Medication Assistance, [facility], [facility]  Post Acute Care Choice:    Choice offered to:     DME Arranged:    DME Agency:     HH Arranged:    HH Agency:     Status of Service:  Completed, signed off  If discussed at [employer], dates discussed:    Additional Comments:  Dawayne Patricia, RN 01/08/2016, 3:21 PM

## 2016-01-08 NOTE — Progress Notes (Signed)
TELEMETRY: Reviewed telemetry pt in NSR: Vitals:   01/07/16 1958 01/07/16 2018 01/07/16 2020 01/08/16 0514  BP: (!) 86/50 (!) 88/56 (!) 85/62 96/60  Pulse: 61 62 64 63  Resp: 18   16  Temp: 98.1 F (36.7 C)   98.1 F (36.7 C)  TempSrc: Oral   Oral  SpO2: 98%   99%  Weight:    192 lb 3.9 oz (87.2 kg)  Height:        Intake/Output Summary (Last 24 hours) at 01/08/16 1115 Last data filed at 01/08/16 0705  Gross per 24 hour  Intake              720 ml  Output             1100 ml  Net             -380 ml   Filed Weights   01/06/16 1042 01/07/16 0714 01/08/16 0514  Weight: 150 lb (68 kg) 193 lb 5.5 oz (87.7 kg) 192 lb 3.9 oz (87.2 kg)    Subjective  Feels much better today. No chest pain or dyspnea.   Marland Kitchen aspirin EC  81 mg Oral Daily  . atorvastatin  10 mg Oral q1800  . carvedilol  3.125 mg Oral BID WC  . sodium chloride flush  3 mL Intravenous Q12H      LABS: Basic Metabolic Panel:  Recent Labs  01/06/16 1142 01/07/16 0609  NA 137 137  K 3.9 4.2  CL 105 104  CO2 25 27  GLUCOSE 100* 98  BUN 8 6  CREATININE 0.75 0.75  CALCIUM 8.6* 8.9   Liver Function Tests: No results for input(s): AST, ALT, ALKPHOS, BILITOT, PROT, ALBUMIN in the last 72 hours. No results for input(s): LIPASE, AMYLASE in the last 72 hours. CBC:  Recent Labs  01/06/16 1142 01/07/16 0609  WBC 8.2 6.3  HGB 13.0 13.0  HCT 38.7 39.4  MCV 92.8 94.0  PLT 228 225   Cardiac Enzymes:  Recent Labs  01/06/16 1847 01/06/16 2356 01/07/16 0609  TROPONINI 0.89* 0.48* 0.28*   BNP: No results for input(s): PROBNP in the last 72 hours. D-Dimer: No results for input(s): DDIMER in the last 72 hours. Hemoglobin A1C:  Recent Labs  01/07/16 0609  HGBA1C 5.1   Fasting Lipid Panel:  Recent Labs  01/07/16 0609  CHOL 150  HDL 32*  LDLCALC 99  TRIG 96  CHOLHDL 4.7   Thyroid Function Tests: No results for input(s): TSH, T4TOTAL, T3FREE, THYROIDAB in the last 72 hours.  Invalid  input(s): FREET3   Radiology/Studies:  No results found. Ecg today shows NSR with marked T wave inversions in the inferior and lateral leads.  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  The left ventricular ejection fraction is 25-35% by visual estimate.  There is moderate to severe left ventricular systolic dysfunction.  LV end diastolic pressure is mildly elevated.  There is no mitral valve regurgitation.   1. Mild non-obstructive CAD.  2. The LAD and large diagonal branch have no obstructive disease. The diagonal branch is similar in caliber to the LAD. The LAD does not reach the apex. The Circumflex artery has no obstructive disease. The RCA is large and gives off a small to moderate caliber PDA branch. This branch tapers down to a small caliber vessel distally. Cannot exclude some diffuse plaque in this distal PDA branch but it is too small for PCI.  3.  Moderately severe LV systolic dysfunction with segmental hypokinesis of the inferoapical, apical and anteroapical walls c/w Takotsubo's cardiomyopathy.      PHYSICAL EXAM General: Well developed, well nourished, in no acute distress. Head: Normocephalic, atraumatic, sclera non-icteric, oropharynx is clear Neck: Negative for carotid bruits. JVD not elevated. No adenopathy Lungs: Clear bilaterally to auscultation without wheezes, rales, or rhonchi. Breathing is unlabored. Heart: RRR S1 S2 without murmurs, rubs, or gallops.  Abdomen: Soft, non-tender, non-distended with normoactive bowel sounds. No hepatomegaly. No rebound/guarding. No obvious abdominal masses. Msk:  Strength and tone appears normal for age. Extremities: No clubbing, cyanosis or edema.  Distal pedal pulses are 2+ and equal bilaterally. Neuro: Alert and oriented X 3. Moves all extremities spontaneously. Psych:  Responds to questions appropriately with a normal affect.  ASSESSMENT AND PLAN: 1. Stress induced cardiomyopathy (Takotsubo syndrome)  with demand ischemia. Peak troponin 0.89. Moderate LV dysfunction by cath. Echo still pending. Symptoms have resolved. On ASA, beta blocker, and ACEi. BP low. Will DC ACEi and continue low dose Coreg. Plan to DC home today post Echo. Will need follow up in office with Ecg. Plan to repeat Echo in 6-8 weeks. 2. Tobacco abuse. Recommend cessation. 3. Mild dyslipidemia. Recommend statin but at lower dose  Present on Admission: . Tobacco use . Elevated troponin . Demand ischemia [facility])   Signed, Peter Martinique, [place] 01/08/2016 11:15 AM

## 2016-01-08 NOTE — Progress Notes (Signed)
Order received to discharge patient.  Patient expresses readiness to discharge.  Discharge instructions, follow up, medications and instructions for their use were gone over with the patient and patient voiced understanding.  Telemetry monitor was removed and CCMD notified.

## 2016-01-09 NOTE — Discharge Summary (Signed)
Patient ID: Sarah Mills,  MRN: [id number], DOB/AGE: 10/16/1975 40 y.o.  Admit date: 01/06/2016 Discharge date: 01/08/2016  Primary Care Provider: No primary care provider on file. Primary Cardiologist: Dr Martinique  Discharge Diagnoses Principal Problem:   Takotsubo syndrome Active Problems:   Tobacco use   Elevated troponin   Demand ischemia [facility])    Procedures: Urgent cath 01/06/16   Hospital Course;  40 y.o. female with past medical history of tobacco use and no prior cardiac history who presents to [facility] ED on 01/06/2016 for evaluation of chest pain.  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 Says the pain radiated to her shoulders bilaterally and was associated with dyspnea and diaphoresis. She walked outside to get some fresh air but says the pain worsened.  EMS was called and she was given SL NTG and IV Morphine with complete resolution of her symptoms.   She denied any prior cardiac history. No history of HTN, HLD, or Type 2 DM. Family history of CAD with father having CABG in his 28's.  Has smoked 0.5 ppd since age 53. Denied any alcohol use or recreational drug use.   While in the ED. EKG showed NSR, HR 73, with TWI in leads III and V3-V5 (TWI in V3-V5 is new when compared to prior tracing).   She was taken urgently to the cath lab. Cath revealed 30% PDA, no other significant CAD with severe LVD with apical HK c/w Takotsubo cardiomyopathy. Her Troponin peaked at 0.89. She was placed on beta blocker, ASA, and low dose statin Rx. Echo done 01/08/16 and showed her EF improved to 40-45%. Dr Martinique felt she could be discharged  01/08/16.   Discharge Vitals:  Blood pressure (!) 92/55, pulse 67, temperature 98.9 F (37.2 C), temperature source Oral, resp. rate 18, height 5\' 9"  (1.753 m), weight 192 lb 3.9 oz (87.2 kg), last menstrual period 01/04/2016, SpO2 100 %.    Labs: No results  found for this or any previous visit (from the past 24 hour(s)).  Disposition:  Follow-up Information    [facility]. Go on 01/15/2016.   Why:   hospital follow up apt made for 2:00 pm- with Dr. Leanne Chang- please arrive 10 min early, and bring photo ID along with current med list Contact information: [place] [phone]       Peter Martinique, MD .   Specialty:  Cardiology Why:  office will conatct you Contact information: [address] [place] [address] [phone]           Discharge Medications:    Medication List    STOP taking these medications   ibuprofen 200 MG tablet Commonly known as:  ADVIL,MOTRIN     TAKE these medications   acetaminophen 500 MG tablet Commonly known as:  TYLENOL Take 1,000 mg by mouth every 6 (six) hours as needed.   aspirin 81 MG EC tablet Take 1 tablet (81 mg total) by mouth daily.   atorvastatin 10 MG tablet Commonly known as:  LIPITOR Take 1 tablet (10 mg total) by mouth daily at 6 PM.   carvedilol 3.125 MG tablet Commonly known as:  COREG Take 1 tablet (3.125 mg total) by mouth 2 (two) times daily with a meal.   nitroGLYCERIN 0.4 MG SL tablet Commonly known as:  NITROSTAT Place 1 tablet (0.4 mg  total) under the tongue every 5 (five) minutes x 3 doses as needed for chest pain.        Duration of Discharge Encounter: Greater than 30 minutes including physician time.  Angelena Form PA-C 01/09/2016 3:44 PM

## 2016-01-15 ENCOUNTER — Ambulatory Visit: Payer: Self-pay | Attending: Internal Medicine | Admitting: Internal Medicine

## 2016-01-15 ENCOUNTER — Encounter: Payer: Self-pay | Admitting: Internal Medicine

## 2016-01-15 DIAGNOSIS — F17210 Nicotine dependence, cigarettes, uncomplicated: Secondary | ICD-10-CM | POA: Insufficient documentation

## 2016-01-15 DIAGNOSIS — I252 Old myocardial infarction: Secondary | ICD-10-CM | POA: Insufficient documentation

## 2016-01-15 DIAGNOSIS — Z955 Presence of coronary angioplasty implant and graft: Secondary | ICD-10-CM | POA: Insufficient documentation

## 2016-01-15 DIAGNOSIS — Z8249 Family history of ischemic heart disease and other diseases of the circulatory system: Secondary | ICD-10-CM | POA: Insufficient documentation

## 2016-01-15 DIAGNOSIS — Z7982 Long term (current) use of aspirin: Secondary | ICD-10-CM | POA: Insufficient documentation

## 2016-01-15 DIAGNOSIS — I5181 Takotsubo syndrome: Secondary | ICD-10-CM

## 2016-01-15 NOTE — Progress Notes (Signed)
Patient is here for HFU for Chest Pain  Patient denies pain at this time.  Patient has taken medication today and patient has eaten today.  BHS- She is feeling well without chest pain. SHe was hospitalized for chest pain. Cath consistent with Takotsubo syndrome. EF by echo ~ 45%.   She is eager to start driving and return to work. She has not yet followed up with CV  She continues to smoke- 3 cigs/day  Past Medical History:  Diagnosis Date  . Headache   . NSTEMI (non-ST elevated myocardial infarction) ([place])   . Tobacco abuse     Social History   Social History  . Marital status: Single    Spouse name: N/A  . Number of children: N/A  . Years of education: N/A   Occupational History  . Not on file.   Social History Main Topics  . Smoking status: Current Every Day Smoker    Packs/day: 0.25    Years: 28.00    Types: Cigarettes  . Smokeless tobacco: Never Used  . Alcohol use No  . Drug use: No  . Sexual activity: Not on file   Other Topics Concern  . Not on file   Social History Narrative  . No narrative on file    Past Surgical History:  Procedure Laterality Date  . CARDIAC CATHETERIZATION N/A 01/06/2016   Procedure: Left Heart Cath and Coronary Angiography;  Surgeon: Burnell Blanks, MD;  Location: [facility];  Service: Cardiovascular;  Laterality: N/A;  . TUBAL LIGATION      Family History  Problem Relation Age of Onset  . CAD Father   . CAD Maternal Grandfather     No Known Allergies  Current Outpatient Prescriptions on File Prior to Visit  Medication Sig Dispense Refill  . acetaminophen (TYLENOL) 500 MG tablet Take 1,000 mg by mouth every 6 (six) hours as needed.    Marland Kitchen aspirin EC 81 MG EC tablet Take 1 tablet (81 mg total) by mouth daily.    Marland Kitchen atorvastatin (LIPITOR) 10 MG tablet Take 1 tablet (10 mg total) by mouth daily at 6 PM. 90 tablet 3  . carvedilol (COREG) 3.125 MG tablet Take 1 tablet (3.125 mg total) by mouth 2 (two) times  daily with a meal. 60 tablet 5  . nitroGLYCERIN (NITROSTAT) 0.4 MG SL tablet Place 1 tablet (0.4 mg total) under the tongue every 5 (five) minutes x 3 doses as needed for chest pain. 25 tablet 2   No current facility-administered medications on file prior to visit.      patient denies chest pain, shortness of breath, orthopnea. Denies lower extremity edema, abdominal pain, change in appetite, change in bowel movements. Patient denies rashes, musculoskeletal complaints. No other specific complaints in a complete review of systems.   BP 110/74 (BP Location: Right Arm, Patient Position: Sitting, Cuff Size: Normal)   Pulse 79   Temp 98.2 F (36.8 C) (Oral)   Resp 18   Ht 5\' 9"  (1.753 m)   Wt 194 lb (88 kg)   LMP 01/04/2016 (Exact Date)   SpO2 99%   BMI 28.65 kg/m  nad Chest- cta cv- reg rate Abe- soft nontender extr- bruising over right radial artery.  Takotsubo syndrome No recurrent chest pain No further evaluation necessary, reviewed cath report Reviewed EKGs She will consider meds (compliant).  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  She had a radial cath- healing well  Ok to start driving Return  to work 1/2 time next week, full time the following week. Stop somking F/u CV

## 2016-01-15 NOTE — Patient Instructions (Signed)
Ok to start driving now Return to work 1/2 time on 01/22/16 Return to work full time on 01/29/16  We will refer you to cardioloby

## 2016-01-15 NOTE — Assessment & Plan Note (Addendum)
No recurrent chest pain No further evaluation necessary, reviewed cath report Reviewed EKGs She will consider meds (compliant).  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  She had a radial cath- healing well  Ok to start driving Return to work 1/2 time next week, full time the following week. Stop somking F/u CV

## 2016-01-17 ENCOUNTER — Encounter: Payer: Self-pay | Admitting: Internal Medicine

## 2016-01-17 ENCOUNTER — Ambulatory Visit (INDEPENDENT_AMBULATORY_CARE_PROVIDER_SITE_OTHER): Payer: Self-pay | Admitting: Internal Medicine

## 2016-01-17 VITALS — BP 90/70 | HR 80 | Ht 69.0 in | Wt 193.1 lb

## 2016-01-17 DIAGNOSIS — I5181 Takotsubo syndrome: Secondary | ICD-10-CM

## 2016-01-17 DIAGNOSIS — Z720 Tobacco use: Secondary | ICD-10-CM

## 2016-01-17 MED ORDER — LISINOPRIL 2.5 MG PO TABS: 2.5000 mg | ORAL_TABLET | Freq: Every day | ORAL | 11 refills | Status: DC

## 2016-01-17 NOTE — Patient Instructions (Addendum)
Medication Instructions:  Your physician recommends that you continue on your current medications as directed. Please refer to the Current Medication list given to you today.   Labwork: BMET in 1-2 weeks  Testing/Procedures: Your physician has requested that you have an echocardiogram in 2 months. Echocardiography is a painless test that uses sound waves to create images of your heart. It provides your doctor with information about the size and shape of your heart and how well your heart's chambers and valves are working. This procedure takes approximately one hour. There are no restrictions for this procedure.    Follow-Up: Your physician recommends that you schedule a follow-up appointment one week after echo.   Any Other Special Instructions Will Be Listed Below (If Applicable).     If you need a refill on your cardiac medications before your next appointment, please call your pharmacy.  Echocardiogram An echocardiogram, or echocardiography, uses sound waves (ultrasound) to produce an image of your heart. The echocardiogram is simple, painless, obtained within a short period of time, and offers valuable information to your health care provider. The images from an echocardiogram can provide information such as:  Evidence of coronary artery disease (CAD).  Heart size.  Heart muscle function.  Heart valve function.  Aneurysm detection.  Evidence of a past heart attack.  Fluid buildup around the heart.  Heart muscle thickening.  Assess heart valve function. LET [facility] CARE PROVIDER KNOW ABOUT:  Any allergies you have.  All medicines you are taking, including vitamins, herbs, eye drops, creams, and over-the-counter medicines.  Previous problems you or members of your family have had with the use of anesthetics.  Any blood disorders you have.  Previous surgeries you have had.  Medical conditions you have.  Possibility of pregnancy, if this applies. BEFORE  THE PROCEDURE  No special preparation is needed. Eat and drink normally.  PROCEDURE   In order to produce an image of your heart, gel will be applied to your chest and a wand-like tool (transducer) will be moved over your chest. The gel will help transmit the sound waves from the transducer. The sound waves will harmlessly bounce off your heart to allow the heart images to be captured in real-time motion. These images will then be recorded.  You may need an IV to receive a medicine that improves the quality of the pictures. AFTER THE PROCEDURE You may return to your normal schedule including diet, activities, and medicines, unless your health care provider tells you otherwise.   This information is not intended to replace advice given to you by your health care provider. Make sure you discuss any questions you have with your health care provider.   Document Released: 04/24/2000 Document Revised: 05/18/2014 Document Reviewed: 01/02/2013 Elsevier Interactive Patient Education [employer].

## 2016-01-17 NOTE — Progress Notes (Signed)
Follow-up Outpatient Visit Date: 01/17/2016  CC: No chief complaint on file.   HPI:  Sarah Mills is a 40 y.o. year-old female recently admitted with acute chest pain found to have mild CAD and stress-induced cardiomyopathy, who presents for follow-up.  Since hospital discharge, Sarah Mills has been feeling well without chest pain, shortness of breath, palpitations, lightheadedness, and edema.  She has been taking carvedilol 3.125 mg BID and atorvastatin 10 mg daily without side effects.  She has not needed to take an SL NTG since hospital discharge.  She has cut her smoking down to ~4 cigarettes/day and is in the process of trying to quit.   Past Medical History:  Diagnosis Date  . Headache   . 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  . Tobacco abuse     Past Surgical History:  Procedure Laterality Date  . CARDIAC CATHETERIZATION N/A 01/06/2016   Procedure: Left Heart Cath and Coronary Angiography;  Surgeon: Burnell Blanks, MD;  Location: [facility];  Service: Cardiovascular;  Laterality: N/A;  . TUBAL LIGATION      Outpatient Encounter Prescriptions as of 01/17/2016  Medication Sig  . acetaminophen (TYLENOL) 500 MG tablet Take 1,000 mg by mouth every 6 (six) hours as needed.  Marland Kitchen aspirin EC 81 MG EC tablet Take 1 tablet (81 mg total) by mouth daily.  Marland Kitchen atorvastatin (LIPITOR) 10 MG tablet Take 1 tablet (10 mg total) by mouth daily at 6 PM.  . carvedilol (COREG) 3.125 MG tablet Take 1 tablet (3.125 mg total) by mouth 2 (two) times daily with a meal.  . nitroGLYCERIN (NITROSTAT) 0.4 MG SL tablet Place 1 tablet (0.4 mg total) under the tongue every 5 (five) minutes x 3 doses as needed for chest pain.  Marland Kitchen lisinopril (PRINIVIL,ZESTRIL) 2.5 MG tablet Take 1 tablet (2.5 mg total) by mouth daily.   No facility-administered encounter medications on file as of 01/17/2016.     Review of patient's allergies  indicates no known allergies.  Social History   Social History  . Marital status: Single    Spouse name: N/A  . Number of children: N/A  . Years of education: N/A   Occupational History  . Not on file.   Social History Main Topics  . Smoking status: Current Every Day Smoker    Packs/day: 0.25    Years: 28.00    Types: Cigarettes  . Smokeless tobacco: Never Used  . Alcohol use No  . Drug use: No  . Sexual activity: Not on file   Other Topics Concern  . Not on file   Social History Narrative  . No narrative on file    Family History  Problem Relation Age of Onset  . CAD Father   . Heart disease Father     Pacemaker and ICD  . Heart attack Father 77  . CAD Maternal Grandfather     Review of Systems: A 12-system review of systems was performed and was negative except as noted in the HPI.  PHYSICAL EXAM: BP 90/70 (BP Location: Left Arm, Patient Position: Sitting, Cuff Size: Normal)   Pulse 80   Ht 5\' 9"  (1.753 m)   Wt 193 lb 1.9 oz (87.6 kg)   LMP 01/04/2016 (Exact Date)   BMI 28.52 kg/m   Repeat BP 110/74  General:  Well-developed, well nourished female, seated comfortably in the exam room. HEENT: No conjunctival pallor or scleral icterus.  Moist  mucous membranes.  OP clear. Neck: Supple without lymphadenopathy, thyromegaly, JVD, or HJR.  No carotid bruit. Lungs: Normal work of breathing.  Clear to auscultation bilaterally without wheezes or crackles. CV: Regular rate and rhythm without murmurs, rubs, or gallops.  Non-displaced PMI. Abd: Bowel sounds present.  Soft, NT/ND without hepatosplenomegaly Ext: No lower extremity edema.  Radial, PT, and DP pulses are 2+ bilaterally.  Mild bruising at right radial arteriotomy site, which appears well-healed. Skin: warm and dry without rash   EKG:  Normal sinus rhythm with anterolateral and inferior T-wave inversions as well as mild QT prolongation. T-wave inversions and QT prolongation are less prominent compared with  prior tracing.  ASSESSMENT AND PLAN: 1. Takotsubo syndrome Patient is doing well following hospitalization for stress-induced cardiomyopathy last month.  There was no clear precipitant for her event.  She has been tolerating carvedilol and atorvastatin well.  EKG shows widespread T-wave inversions and mild QT prolongation, which are likely related to stress-induced cardiomyopathy and are less prominent than on prior EKG's.  ACEI/ARB was not started during her hospitalization due to low blood pressures.  We have agreed to start lisinopril 2.5 mg daily and have the patient return for a BMP in 1-2 weeks to monitor renal function and electrolytes.  The patient was counseled regarding potential teratogenic effects of ACEI; she reports prior tubal ligation and does not intend to become pregnant.  We will plan to repeat an echocardiogram in ~2 months to reassess her LV function.  We will plan on indefinite therapy with carvedilol, lisinopril, ASA, and atorvastatin (given mild CAD on recent LHC).  2. Tobacco use Patient has cut back and is in the process of trying to quit.  Smoking cessation counseling was provided.  Follow-up: Return to clinic in 2-3 months to review results of echo.   Nelva Bush, MD 01/17/2016 4:28 PM

## 2016-01-22 ENCOUNTER — Other Ambulatory Visit: Payer: Self-pay

## 2016-01-23 ENCOUNTER — Ambulatory Visit: Payer: Self-pay

## 2016-01-24 ENCOUNTER — Other Ambulatory Visit (INDEPENDENT_AMBULATORY_CARE_PROVIDER_SITE_OTHER): Payer: Self-pay | Admitting: *Deleted

## 2016-01-24 DIAGNOSIS — I5181 Takotsubo syndrome: Secondary | ICD-10-CM

## 2016-01-25 LAB — BASIC METABOLIC PANEL
BUN / CREAT RATIO: 13 (ref 9–23)
BUN: 11 mg/dL (ref 6–24)
CO2: 25 mmol/L (ref 18–29)
Calcium: 8.9 mg/dL (ref 8.7–10.2)
Chloride: 98 mmol/L (ref 96–106)
Creatinine, Ser: 0.82 mg/dL (ref 0.57–1.00)
GFR, EST AFRICAN AMERICAN: 103 mL/min/{1.73_m2} (ref >59)
GFR, EST NON AFRICAN AMERICAN: 89 mL/min/{1.73_m2} (ref >59)
Glucose: 83 mg/dL (ref 65–99)
Potassium: 4.3 mmol/L (ref 3.5–5.2)
SODIUM: 138 mmol/L (ref 134–144)

## 2016-03-19 ENCOUNTER — Other Ambulatory Visit: Payer: Self-pay

## 2016-03-23 NOTE — Progress Notes (Deleted)
Follow-up Outpatient Visit Date: 03/24/2016  Chief Complaint: Follow-up stress-induced cardiomyopathy  HPI:  [name] is a 40 y.o. 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  She presented to the ED on 01/06/16 with chest pain and diaphoresis and underwent urgent coronary angiography revealing mild CAD involving the PDA and severely reduced LV contraction in a pattern of Takotsubo cardiomyopathy.  She was started on evidence-based heart failure therapy and was doing well at our follow-up visit in 01/2016.  ***  --------------------------------------------------------------------------------------------------  Cardiovascular History & Procedures: Cardiovascular Problems:  Stress-induced cardiomyopathy  Mild coronary artery disease  Risk Factors:  None  Cath/PCI:  LHC (01/06/16): LMCA normal, LAD normal, LCX normal.  RCA normal with diffuse 30% stenosis involving the mid and distal PDA.  LVEF 25-35% with mid and apical hypokinesis.  CV Surgery:  None  EP Procedures and Devices:  None  Non-Invasive Evaluation(s):  TTE (01/08/16): Normal LV size and wall thickness with moderately reduced contraction (EF 40-45%). Normal RV size and function.  No significant valvular abnormalities.  Recent CV Pertinent Labs: Lab Results  Component Value Date   CHOL 150 01/07/2016   HDL 32 (L) 01/07/2016   LDLCALC 99 01/07/2016   TRIG 96 01/07/2016   CHOLHDL 4.7 01/07/2016   INR 1.13 01/06/2016   K 4.3 01/24/2016   K 3.9 12/12/2013   BUN 11 01/24/2016   BUN 6 (L) 12/12/2013   CREATININE 0.82 01/24/2016   CREATININE 0.87 12/12/2013     Past medical and surgical history were reviewed and updated in EPIC.   Outpatient Encounter Prescriptions as of 03/24/2016  Medication Sig  . acetaminophen (TYLENOL) 500 MG tablet Take 1,000 mg by mouth every 6 (six) hours as needed.  Marland Kitchen aspirin EC 81  MG EC tablet Take 1 tablet (81 mg total) by mouth daily.  Marland Kitchen atorvastatin (LIPITOR) 10 MG tablet Take 1 tablet (10 mg total) by mouth daily at 6 PM.  . carvedilol (COREG) 3.125 MG tablet Take 1 tablet (3.125 mg total) by mouth 2 (two) times daily with a meal.  . lisinopril (PRINIVIL,ZESTRIL) 2.5 MG tablet Take 1 tablet (2.5 mg total) by mouth daily.  . nitroGLYCERIN (NITROSTAT) 0.4 MG SL tablet Place 1 tablet (0.4 mg total) under the tongue every 5 (five) minutes x 3 doses as needed for chest pain.   No facility-administered encounter medications on file as of 03/24/2016.     Allergies: Patient has no known allergies.  Social History   Social History  . Marital status: Single    Spouse name: N/A  . Number of children: N/A  . Years of education: N/A   Occupational History  . Not on file.   Social History Main Topics  . Smoking status: Current Every Day Smoker    Packs/day: 0.25    Years: 28.00    Types: Cigarettes  . Smokeless tobacco: Never Used  . Alcohol use No  . Drug use: No  . Sexual activity: Not on file   Other Topics Concern  . Not on file   Social History Narrative  . No narrative on file    Family History  Problem Relation Age of Onset  . CAD Father   . Heart disease Father     Pacemaker and ICD  . Heart attack Father 24  . CAD Maternal Grandfather     Review of Systems: A 12-system review of systems was performed and was negative except  as noted in the HPI.  --------------------------------------------------------------------------------------------------  Physical Exam: There were no vitals taken for this visit.  General:  *** HEENT: No conjunctival pallor or scleral icterus.  Moist mucous membranes.  OP clear. Neck: Supple without lymphadenopathy, thyromegaly, JVD, or HJR.  No carotid bruit. Lungs: Normal work of breathing.  Clear to auscultation bilaterally without wheezes or crackles. Heart: Regular rate and rhythm without murmurs, rubs, or  gallops.  Non-displaced PMI. Abd: Bowel sounds present.  Soft, NT/ND without hepatosplenomegaly Ext: No lower extremity edema.  Radial, PT, and DP pulses are 2+ bilaterally. Skin: warm and dry without rash  EKG:  ***  Lab Results  Component Value Date   WBC 6.3 01/07/2016   HGB 13.0 01/07/2016   HCT 39.4 01/07/2016   MCV 94.0 01/07/2016   PLT 225 01/07/2016    Lab Results  Component Value Date   NA 138 01/24/2016   K 4.3 01/24/2016   CL 98 01/24/2016   CO2 25 01/24/2016   BUN 11 01/24/2016   CREATININE 0.82 01/24/2016   GLUCOSE 83 01/24/2016    Lab Results  Component Value Date   CHOL 150 01/07/2016   HDL 32 (L) 01/07/2016   LDLCALC 99 01/07/2016   TRIG 96 01/07/2016   CHOLHDL 4.7 01/07/2016    --------------------------------------------------------------------------------------------------  ASSESSMENT AND PLAN: Harrell Gave Loriel Diehl, MD 03/23/2016 5:05 PM

## 2016-03-24 ENCOUNTER — Ambulatory Visit: Payer: Self-pay | Admitting: Internal Medicine

## 2016-03-24 ENCOUNTER — Encounter: Payer: Self-pay | Admitting: *Deleted

## 2016-04-10 ENCOUNTER — Other Ambulatory Visit: Payer: Self-pay

## 2016-04-10 ENCOUNTER — Ambulatory Visit (INDEPENDENT_AMBULATORY_CARE_PROVIDER_SITE_OTHER): Payer: Self-pay

## 2016-04-10 DIAGNOSIS — I5181 Takotsubo syndrome: Secondary | ICD-10-CM

## 2016-04-21 ENCOUNTER — Encounter: Payer: Self-pay | Admitting: *Deleted

## 2016-04-29 ENCOUNTER — Encounter: Payer: Self-pay | Admitting: *Deleted

## 2016-04-29 ENCOUNTER — Ambulatory Visit: Payer: Self-pay | Admitting: Internal Medicine

## 2016-04-29 NOTE — Progress Notes (Deleted)
Follow-up Outpatient Visit Date: 04/29/2016  Chief Complaint: ***  HPI:  [name] is a 40 y.o. year-old female with history of stress induced cardiomyopathy in 12/2015, who presents for follow-up. She developed chest pain on 01/06/16 and underwent urgent coronary angiography revealing mild, nonobstructive CAD but a severely reduced LVEF of 25-35% consistent with stress-induced cardiomyopathy. Patient was initiated on medical therapy with repeat echocardiogram earlier this month demonstrating normalization of LV function.  --------------------------------------------------------------------------------------------------  Cardiovascular History & Procedures: Cardiovascular Problems:  Stress-induced cardiomyopathy  Risk Factors:  Tobacco use  Cath/PCI:  LHC (01/06/16): 30% mid RPDA stenosis. Otherwise, no significant CAD. Severely reduced LV contraction with an EF of 25-35% with mildly elevated left ventricular filling pressure.  CV Surgery:  None  EP Procedures and Devices:  None  Non-Invasive Evaluation(s):  Limited TTE (04/10/16): Normal LV size and function with LVEF of 55-60%. Normal RV size and function.  TTE (01/08/16): Normal LV size with mild to moderately reduced contraction (EF 40-45%). Normal RV size and function. No significant valvular abnormalities.  Recent CV Pertinent Labs: Lab Results  Component Value Date   CHOL 150 01/07/2016   HDL 32 (L) 01/07/2016   LDLCALC 99 01/07/2016   TRIG 96 01/07/2016   CHOLHDL 4.7 01/07/2016   INR 1.13 01/06/2016   K 4.3 01/24/2016   K 3.9 12/12/2013   BUN 11 01/24/2016   BUN 6 (L) 12/12/2013   CREATININE 0.82 01/24/2016   CREATININE 0.87 12/12/2013     Past medical and surgical history were reviewed and updated in EPIC.   Outpatient Encounter Prescriptions as of 04/29/2016  Medication Sig  . acetaminophen (TYLENOL) 500 MG tablet Take 1,000 mg by mouth every 6 (six) hours as needed.  Marland Kitchen aspirin EC 81 MG EC tablet  Take 1 tablet (81 mg total) by mouth daily.  Marland Kitchen atorvastatin (LIPITOR) 10 MG tablet Take 1 tablet (10 mg total) by mouth daily at 6 PM.  . carvedilol (COREG) 3.125 MG tablet Take 1 tablet (3.125 mg total) by mouth 2 (two) times daily with a meal.  . lisinopril (PRINIVIL,ZESTRIL) 2.5 MG tablet Take 1 tablet (2.5 mg total) by mouth daily.  . nitroGLYCERIN (NITROSTAT) 0.4 MG SL tablet Place 1 tablet (0.4 mg total) under the tongue every 5 (five) minutes x 3 doses as needed for chest pain.   No facility-administered encounter medications on file as of 04/29/2016.     Allergies: Patient has no known allergies.  Social History   Social History  . Marital status: Single    Spouse name: N/A  . Number of children: N/A  . Years of education: N/A   Occupational History  . Not on file.   Social History Main Topics  . Smoking status: Current Every Day Smoker    Packs/day: 0.25    Years: 28.00    Types: Cigarettes  . Smokeless tobacco: Never Used  . Alcohol use No  . Drug use: No  . Sexual activity: Not on file   Other Topics Concern  . Not on file   Social History Narrative  . No narrative on file    Family History  Problem Relation Age of Onset  . CAD Father   . Heart disease Father     Pacemaker and ICD  . Heart attack Father 71  . CAD Maternal Grandfather     Review of Systems: A 12-system review of systems was performed and was negative except as noted in the HPI.  --------------------------------------------------------------------------------------------------  Physical Exam:  There were no vitals taken for this visit.  General:  *** HEENT: No conjunctival pallor or scleral icterus.  Moist mucous membranes.  OP clear. Neck: Supple without lymphadenopathy, thyromegaly, JVD, or HJR.  No carotid bruit. Lungs: Normal work of breathing.  Clear to auscultation bilaterally without wheezes or crackles. Heart: Regular rate and rhythm without murmurs, rubs, or gallops.   Non-displaced PMI. Abd: Bowel sounds present.  Soft, NT/ND without hepatosplenomegaly Ext: No lower extremity edema.  Radial, PT, and DP pulses are 2+ bilaterally. Skin: warm and dry without rash  EKG:  ***  Lab Results  Component Value Date   WBC 6.3 01/07/2016   HGB 13.0 01/07/2016   HCT 39.4 01/07/2016   MCV 94.0 01/07/2016   PLT 225 01/07/2016    Lab Results  Component Value Date   NA 138 01/24/2016   K 4.3 01/24/2016   CL 98 01/24/2016   CO2 25 01/24/2016   BUN 11 01/24/2016   CREATININE 0.82 01/24/2016   GLUCOSE 83 01/24/2016    Lab Results  Component Value Date   CHOL 150 01/07/2016   HDL 32 (L) 01/07/2016   LDLCALC 99 01/07/2016   TRIG 96 01/07/2016   CHOLHDL 4.7 01/07/2016    --------------------------------------------------------------------------------------------------  ASSESSMENT AND PLAN: Harrell Gave Jediah Horger, MD 04/29/2016 2:06 PM

## 2016-09-14 ENCOUNTER — Telehealth: Payer: Self-pay | Admitting: Internal Medicine

## 2016-09-14 NOTE — Telephone Encounter (Signed)
Pt c/o of Chest Pain: STAT if CP now or developed within 24 hours  1. Are you having CP right now?  No  2. Are you experiencing any other symptoms (ex. SOB, nausea, vomiting, sweating)?  Just pains in her arm  3. How long have you been experiencing CP?  Since 430 am   4. Is your CP continuous or coming and going?  First thing this morning it was constant and then took a nitro and it worked but then came back and took another one  5. Have you taken Nitroglycerin?  Has taken two pills, pains eased off and now has a headache   ?

## 2016-09-14 NOTE — Telephone Encounter (Signed)
Thank you for the update. I agree with these recommendations. Given recent stress and history of stress-induced cardiomyopathy, [name] is at risk for recurrence. She should call 911 if chest pain or shortness of breath recur.

## 2016-09-14 NOTE — Telephone Encounter (Signed)
Received incoming call from patient. She had episode of chest pain this morning around 0430. She experienced chest pain, SOB, and sweating at that time. Last night, she had left arm pain but has not had that this morning. She took 2 nitro and the pain subsided. Now she has a slight headache. She is not experiencing chest pain, SOB or sweating at this time. Patient does not have a way to take her BP. Patient admits to experiencing a lot of stress recently with family and work. Her father died recently and her daughter had stressful pregnancy and a traumatic birth.  Patient instructed to call 911 and go to ER if the described symptoms occur again or if she develops new or worsening symptoms. She verbalized understanding. In the meantime, appt scheduled with Ignacia Bayley, NP on 09/18/16 at 11:30.  Routing to Dr End for any further advice.

## 2016-09-14 NOTE — Telephone Encounter (Signed)
Called patient and she verbalized understanding of Dr Darnelle Bos instructions and to keep upcoming appt.

## 2016-09-18 ENCOUNTER — Ambulatory Visit: Payer: Self-pay | Admitting: Nurse Practitioner

## 2016-09-21 ENCOUNTER — Encounter: Payer: Self-pay | Admitting: Nurse Practitioner

## 2017-01-14 ENCOUNTER — Emergency Department
Admission: EM | Admit: 2017-01-14 | Discharge: 2017-01-14 | Disposition: A | Discharge status: Home / Self Care | Payer: Self-pay | Attending: Emergency Medicine | Admitting: Emergency Medicine

## 2017-01-14 ENCOUNTER — Encounter: Payer: Self-pay | Admitting: *Deleted

## 2017-01-14 DIAGNOSIS — Y939 Activity, unspecified: Secondary | ICD-10-CM | POA: Insufficient documentation

## 2017-01-14 DIAGNOSIS — Z7982 Long term (current) use of aspirin: Secondary | ICD-10-CM | POA: Insufficient documentation

## 2017-01-14 DIAGNOSIS — Y999 Unspecified external cause status: Secondary | ICD-10-CM | POA: Insufficient documentation

## 2017-01-14 DIAGNOSIS — I252 Old myocardial infarction: Secondary | ICD-10-CM | POA: Insufficient documentation

## 2017-01-14 DIAGNOSIS — W458XXA Other foreign body or object entering through skin, initial encounter: Secondary | ICD-10-CM | POA: Insufficient documentation

## 2017-01-14 DIAGNOSIS — F17210 Nicotine dependence, cigarettes, uncomplicated: Secondary | ICD-10-CM | POA: Insufficient documentation

## 2017-01-14 DIAGNOSIS — Y929 Unspecified place or not applicable: Secondary | ICD-10-CM | POA: Insufficient documentation

## 2017-01-14 DIAGNOSIS — T161XXA Foreign body in right ear, initial encounter: Secondary | ICD-10-CM | POA: Insufficient documentation

## 2017-01-14 DIAGNOSIS — Z79899 Other long term (current) drug therapy: Secondary | ICD-10-CM | POA: Insufficient documentation

## 2017-01-14 MED ORDER — LIDOCAINE HCL (PF) 1 % IJ SOLN
INTRAMUSCULAR | Status: AC
  Filled 2017-01-14: qty 5

## 2017-01-14 NOTE — ED Triage Notes (Signed)
Pt has bug in right ear.  Pt reports it flying around inside the ear.  Pt was asleep when bug flew into right ear.  Pt alert.

## 2017-01-14 NOTE — ED Provider Notes (Signed)
[facility] Emergency Department Provider Note    First MD Initiated Contact with Patient 01/14/17 0037     (approximate)  I have reviewed the triage vital signs and the nursing notes.   HISTORY  Chief Complaint Foreign Body in Ear    HPI Sarah Mills is a 41 y.o. female with Sarah Mills of chronic medical conditions presents to emergency department with insect in the right ear which occurred immediately before presentation to the emergency department.   Past Medical History:  Diagnosis Date  . Headache   . 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  . Tobacco abuse     Patient Active Problem List   Diagnosis Date Noted  . Demand ischemia ([place]) 01/07/2016  . Tobacco use 01/06/2016  . Takotsubo syndrome     Past Surgical History:  Procedure Laterality Date  . CARDIAC CATHETERIZATION N/A 01/06/2016   Procedure: Left Heart Cath and Coronary Angiography;  Surgeon: Burnell Blanks, MD;  Location: [place] [facility];  Service: Cardiovascular;  Laterality: N/A;  . TUBAL LIGATION      Prior to Admission medications   Medication Sig Start Date End Date Taking? Authorizing Provider  acetaminophen (TYLENOL) 500 MG tablet Take 1,000 mg by mouth every 6 (six) hours as needed.    [provider]  aspirin EC 81 MG EC tablet Take 1 tablet (81 mg total) by mouth daily. 01/08/16   Erlene Quan, PA-C  atorvastatin (LIPITOR) 10 MG tablet Take 1 tablet (10 mg total) by mouth daily at 6 PM. 01/08/16   Rosalyn Gess, Doreene Burke, PA-C  carvedilol (COREG) 3.125 MG tablet Take 1 tablet (3.125 mg total) by mouth 2 (two) times daily with a meal. 01/08/16   Kilroy, Doreene Burke, PA-C  lisinopril (PRINIVIL,ZESTRIL) 2.5 MG tablet Take 1 tablet (2.5 mg total) by mouth daily. 01/17/16   End, Harrell Gave, MD  nitroGLYCERIN (NITROSTAT) 0.4 MG SL tablet Place 1 tablet (0.4 mg total) under the tongue every 5 (five)  minutes x 3 doses as needed for chest pain. 01/08/16   Erlene Quan, PA-C    Allergies no known drug allergies  Family History  Problem Relation Age of Onset  . CAD Father   . Heart disease Father        Pacemaker and ICD  . Heart attack Father 14  . CAD Maternal Grandfather     Social History Social History  Substance Use Topics  . Smoking status: Current Every Day Smoker    Packs/day: 0.25    Years: 28.00    Types: Cigarettes  . Smokeless tobacco: Never Used  . Alcohol use No    Review of Systems Constitutional: No fever/chills Eyes: No visual changes. ENT: No sore throat. Cardiovascular: Denies chest pain. Respiratory: Denies shortness of breath. Gastrointestinal: No abdominal pain.  No nausea, no vomiting.  No diarrhea.  No constipation. Genitourinary: Negative for dysuria. Musculoskeletal: Negative for neck pain.  Negative for back pain. Integumentary: Negative for rash. Neurological: Negative for headaches, focal weakness or numbness.  ____________________________________________   PHYSICAL EXAM:  VITAL SIGNS: ED Triage Vitals  Enc Vitals Group     BP 01/14/17 0028 (!) 119/96     Pulse Rate 01/14/17 0028 71     Resp 01/14/17 0028 18     Temp 01/14/17 0028 97.7 F (36.5 C)     Temp Source 01/14/17 0028 Oral     SpO2 01/14/17 0028  99 %     Weight 01/14/17 0021 65.8 kg (145 lb)     Height 01/14/17 0021 1.753 m (5\' 9" )     Head Circumference --      Peak Flow --      Pain Score 01/14/17 0021 10     Pain Loc --      Pain Edu? --      Excl. in [place]? --     Constitutional: Alert and oriented. apparent distress Eyes: Conjunctivae are normal.  Head: Atraumatic. Ears:  insect noted in the right external auditory canal. Excoriations noted in the right EAC Mouth/Throat: Mucous membranes are moist.  Oropharynx non-erythematous. Neck: No stridor.   Skin:  Skin is warm, dry and intact. No rash noted.   ____________________________________________    LABS (all labs ordered are listed, but only abnormal results are displayed)    .Foreign Body Removal Date/Time: 01/14/2017 1:00 AM Performed by: Gregor Hams Authorized by: Gregor Hams  Consent: Verbal consent obtained. Written consent not obtained. Consent given by: patient Body area: ear Location details: right ear  Anesthesia: Local Anesthetic: lidocaine 1% without epinephrine Anesthetic total: 5 mL  Sedation: Patient sedated: no Patient restrained: no Patient cooperative: no Localization method: ENT speculum 1 objects recovered. Objects recovered: insect Patient tolerance: Patient tolerated the procedure well with no immediate complications     ____________________________________________   INITIAL IMPRESSION / ASSESSMENT AND PLAN / ED COURSE  Pertinent labs & imaging results that were available during my care of the patient were reviewed by me and considered in my medical decision making (see chart for details).  Tympanic membrane visualized  following insect removal and appears to be intact      ____________________________________________  FINAL CLINICAL IMPRESSION(S) / ED DIAGNOSES  Final diagnoses:  Ear foreign body, right, initial encounter     MEDICATIONS GIVEN DURING THIS VISIT:  Medications  lidocaine (PF) (XYLOCAINE) 1 % injection (not administered)     NEW OUTPATIENT MEDICATIONS STARTED DURING THIS VISIT:  New Prescriptions   No medications on file    Modified Medications   No medications on file    Discontinued Medications   No medications on file     Note:  This document was prepared using Dragon voice recognition software and may include unintentional dictation errors.    Gregor Hams, MD 01/14/17 [phone]

## 2017-01-14 NOTE — ED Notes (Signed)
Pt reports while sleeping a bug flew into right ear.  Flying insect removed by dr brown in triage with meds and suction.  Pt tolerated well.

## 2017-06-02 ENCOUNTER — Encounter: Payer: Self-pay | Admitting: Intensive Care

## 2017-06-02 ENCOUNTER — Emergency Department
Admission: EM | Admit: 2017-06-02 | Discharge: 2017-06-02 | Disposition: A | Discharge status: Home / Self Care | Payer: BLUE CROSS/BLUE SHIELD | Attending: Emergency Medicine | Admitting: Emergency Medicine

## 2017-06-02 DIAGNOSIS — F17210 Nicotine dependence, cigarettes, uncomplicated: Secondary | ICD-10-CM | POA: Diagnosis not present

## 2017-06-02 DIAGNOSIS — H10023 Other mucopurulent conjunctivitis, bilateral: Secondary | ICD-10-CM | POA: Diagnosis not present

## 2017-06-02 DIAGNOSIS — Z79899 Other long term (current) drug therapy: Secondary | ICD-10-CM | POA: Insufficient documentation

## 2017-06-02 DIAGNOSIS — Z7982 Long term (current) use of aspirin: Secondary | ICD-10-CM | POA: Insufficient documentation

## 2017-06-02 DIAGNOSIS — H5711 Ocular pain, right eye: Secondary | ICD-10-CM | POA: Diagnosis present

## 2017-06-02 DIAGNOSIS — I252 Old myocardial infarction: Secondary | ICD-10-CM | POA: Insufficient documentation

## 2017-06-02 MED ORDER — NAPHAZOLINE-PHENIRAMINE 0.025-0.3 % OP SOLN: 1.0000 [drp] | Freq: Four times a day (QID) | OPHTHALMIC | 0 refills | Status: DC | PRN

## 2017-06-02 MED ORDER — GENTAMICIN SULFATE 0.3 % OP SOLN: 1.0000 [drp] | OPHTHALMIC | 0 refills | Status: DC

## 2017-06-02 NOTE — ED Provider Notes (Signed)
[facility] Emergency Department Provider Note   ____________________________________________   First MD Initiated Contact with Patient 06/02/17 [phone]     (approximate)  I have reviewed the triage vital signs and the nursing notes.   HISTORY  Chief Complaint Eye Pain (Right)    HPI Sarah Mills is a 42 y.o. female patient presents with purulent right eye discharge and bilateral erythematous conjunctiva.  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  Patient denies vision disturbance.  Patient states this itching and drainage.  Matted eyelids requiring warm compresses to dislodge material upon a.m. awakening today.   Past Medical History:  Diagnosis Date  . Headache   . 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  . Tobacco abuse     Patient Active Problem List   Diagnosis Date Noted  . Demand ischemia ([place]) 01/07/2016  . Tobacco use 01/06/2016  . Takotsubo syndrome     Past Surgical History:  Procedure Laterality Date  . CARDIAC CATHETERIZATION N/A 01/06/2016   Procedure: Left Heart Cath and Coronary Angiography;  Surgeon: Burnell Blanks, MD;  Location: [place] [facility];  Service: Cardiovascular;  Laterality: N/A;  . TUBAL LIGATION      Prior to Admission medications   Medication Sig Start Date End Date Taking? Authorizing Provider  acetaminophen (TYLENOL) 500 MG tablet Take 1,000 mg by mouth every 6 (six) hours as needed.    [provider]  aspirin EC 81 MG EC tablet Take 1 tablet (81 mg total) by mouth daily. 01/08/16   Erlene Quan, PA-C  atorvastatin (LIPITOR) 10 MG tablet Take 1 tablet (10 mg total) by mouth daily at 6 PM. 01/08/16   Kilroy, Doreene Burke, PA-C  carvedilol (COREG) 3.125 MG tablet Take 1 tablet (3.125 mg total) by mouth 2 (two) times daily with a meal. 01/08/16   Kilroy, Doreene Burke, PA-C  gentamicin  (GARAMYCIN) 0.3 % ophthalmic solution Place 1 drop into both eyes every 4 (four) hours. 06/02/17   Sable Feil, PA-C  lisinopril (PRINIVIL,ZESTRIL) 2.5 MG tablet Take 1 tablet (2.5 mg total) by mouth daily. 01/17/16   End, Harrell Gave, MD  naphazoline-pheniramine (NAPHCON-A) 0.025-0.3 % ophthalmic solution Place 1 drop into both eyes 4 (four) times daily as needed for eye irritation. 06/02/17   Sable Feil, PA-C  nitroGLYCERIN (NITROSTAT) 0.4 MG SL tablet Place 1 tablet (0.4 mg total) under the tongue every 5 (five) minutes x 3 doses as needed for chest pain. 01/08/16   Erlene Quan, PA-C    Allergies Patient has no known allergies.  Family History  Problem Relation Age of Onset  . CAD Father   . Heart disease Father        Pacemaker and ICD  . Heart attack Father 53  . CAD Maternal Grandfather     Social History Social History   Tobacco Use  . Smoking status: Current Every Day Smoker    Packs/day: 0.25    Years: 28.00    Pack years: 7.00    Types: Cigarettes  . Smokeless tobacco: Never Used  Substance Use Topics  . Alcohol use: No  . Drug use: No    Review of Systems Constitutional: No fever/chills Eyes: No visual changes. ENT: No sore throat. Cardiovascular: Denies chest pain. Respiratory: Denies shortness of breath. Gastrointestinal: No abdominal pain.  No nausea, no vomiting.  No diarrhea.  No constipation. Genitourinary: Negative  for dysuria. Musculoskeletal: Negative for back pain. Skin: Negative for rash. Neurological: Negative for headaches, focal weakness or numbness.   ____________________________________________   PHYSICAL EXAM:  VITAL SIGNS: ED Triage Vitals  Enc Vitals Group     BP 06/02/17 0835 (!) 146/77     Pulse Rate 06/02/17 0835 84     Resp 06/02/17 0835 14     Temp 06/02/17 0835 97.8 F (36.6 C)     Temp Source 06/02/17 0835 Oral     SpO2 06/02/17 0835 100 %     Weight 06/02/17 0834 134 lb (60.8 kg)     Height 06/02/17 0834 5\' 9"   (1.753 m)     Head Circumference --      Peak Flow --      Pain Score --      Pain Loc --      Pain Edu? --      Excl. in [place]? --     Constitutional: Alert and oriented. Well appearing and in no acute distress. Eyes: Conjunctivae are erythematous l. PERRL. EOMI. yellowish greenish drainage right eye Cardiovascular: Normal rate, regular rhythm. Grossly normal heart sounds.  Good peripheral circulation. Respiratory: Normal respiratory effort.  No retractions. Lungs CTAB. Skin:  Skin is warm, dry and intact. No rash noted.   ____________________________________________   LABS (all labs ordered are listed, but only abnormal results are displayed)  Labs Reviewed - No data to display ____________________________________________  EKG   ____________________________________________  RADIOLOGY  No results found.  ____________________________________________   PROCEDURES  Procedure(s) performed: None  Procedures  Critical Care performed: No  ____________________________________________   INITIAL IMPRESSION / ASSESSMENT AND PLAN / ED COURSE  As part of my medical decision making, I reviewed the following data within the electronic MEDICAL RECORD NUMBER    Bacterial conjunctivitis.  Patient given discharge care instruction work note.  Patient advised to use eyedrops as directed.  Follow-up with  PCP if condition persists.      ____________________________________________   FINAL CLINICAL IMPRESSION(S) / ED DIAGNOSES  Final diagnoses:  Other mucopurulent conjunctivitis of both eyes     ED Discharge Orders        Ordered    gentamicin (GARAMYCIN) 0.3 % ophthalmic solution  Every 4 hours     06/02/17 0920    naphazoline-pheniramine (NAPHCON-A) 0.025-0.3 % ophthalmic solution  4 times daily PRN     06/02/17 0920       Note:  This document was prepared using Dragon voice recognition software and may include unintentional dictation errors.    Sable Feil,  PA-C 06/02/17 1610    Eula Listen, MD 06/02/17 [phone]

## 2017-06-02 NOTE — ED Triage Notes (Signed)
Patient is here for possible pink eye to right side. Reports itching and drainage. No swelling, redness, or drainage noted in triage

## 2017-06-02 NOTE — ED Notes (Signed)
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

## 2017-08-30 IMAGING — DX DG CHEST 2V
2 series · 2 of 2 positions shown · non-contrast
Comparison: None.

CLINICAL DATA: Chest pain.

EXAM:
CHEST  2 VIEW

[chest pa]
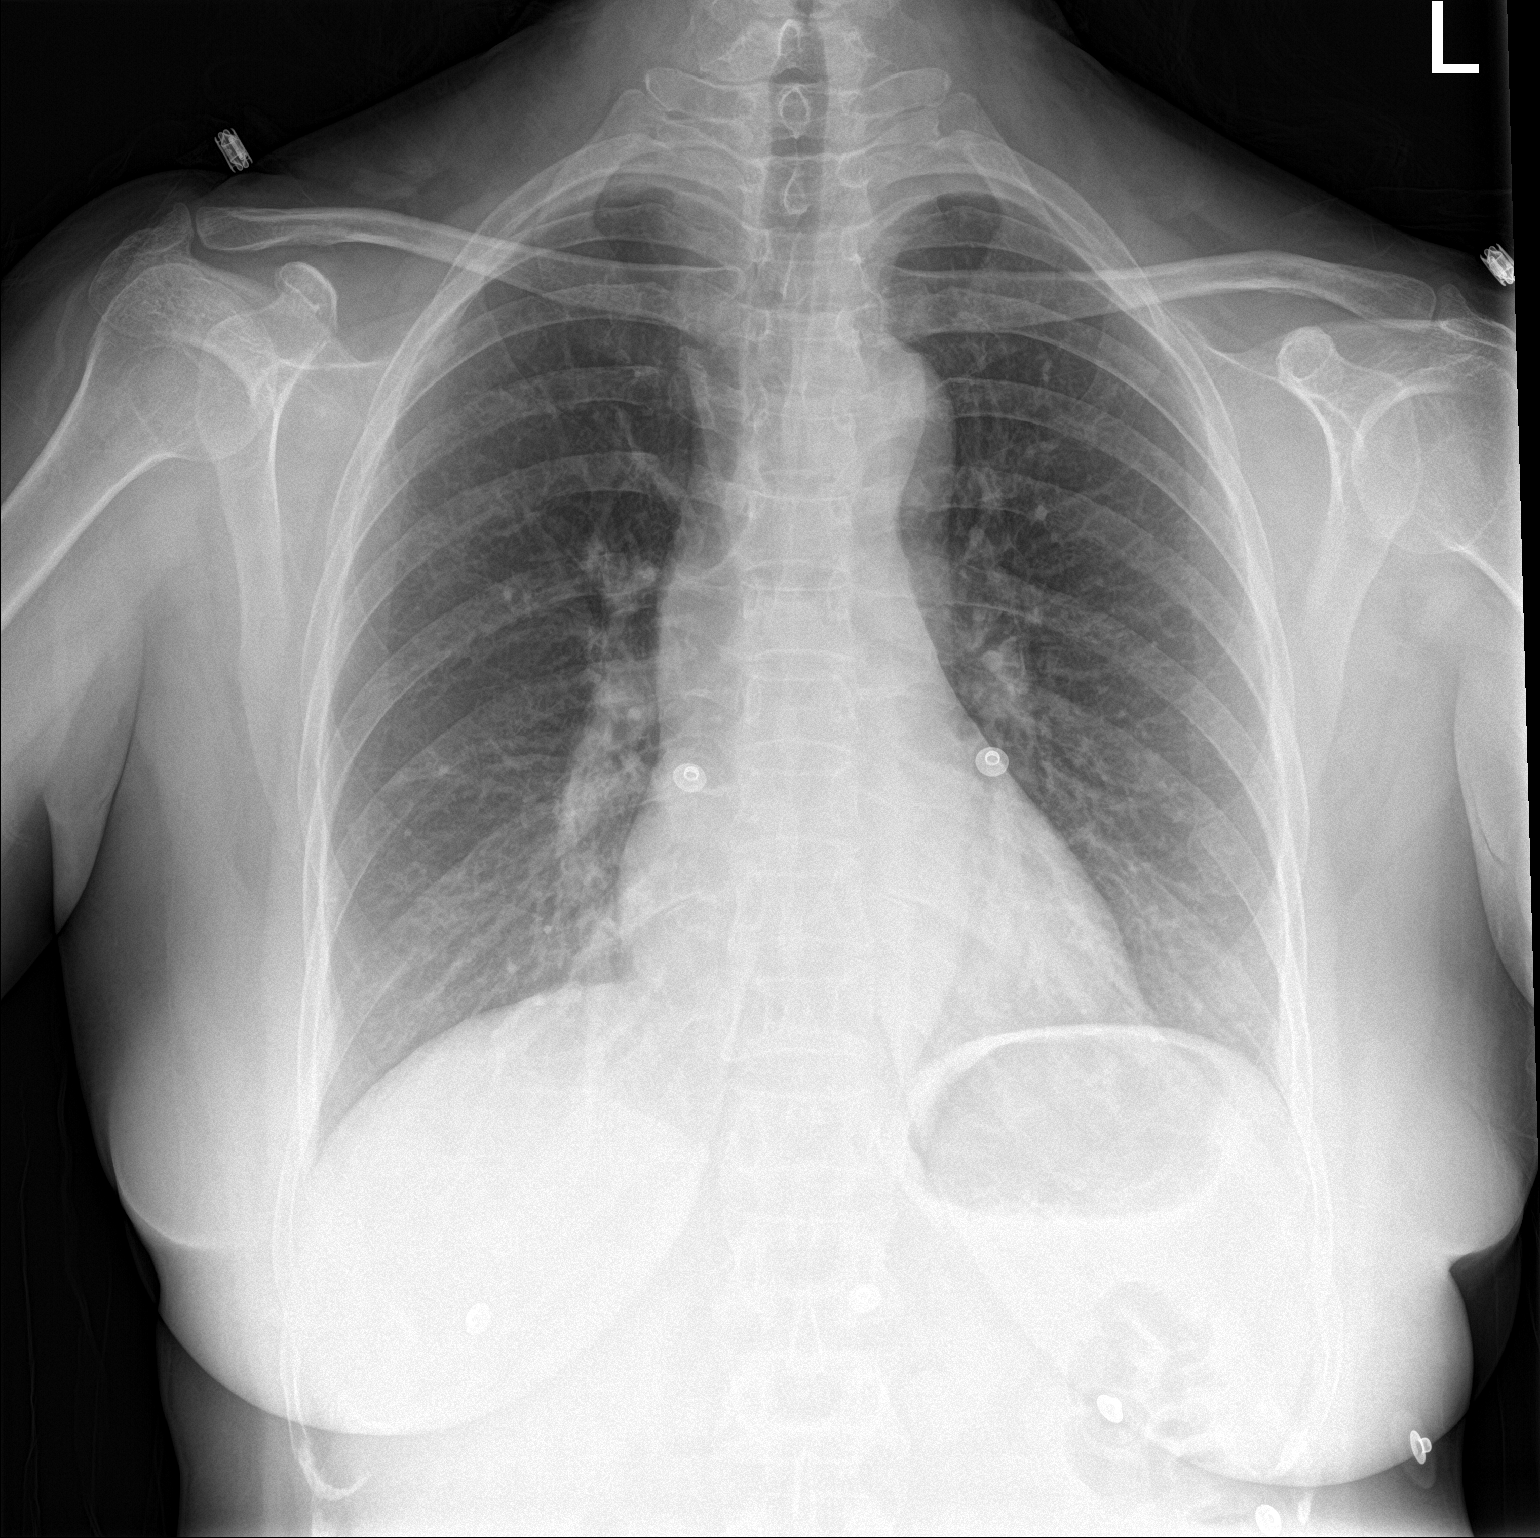

[chest lat]
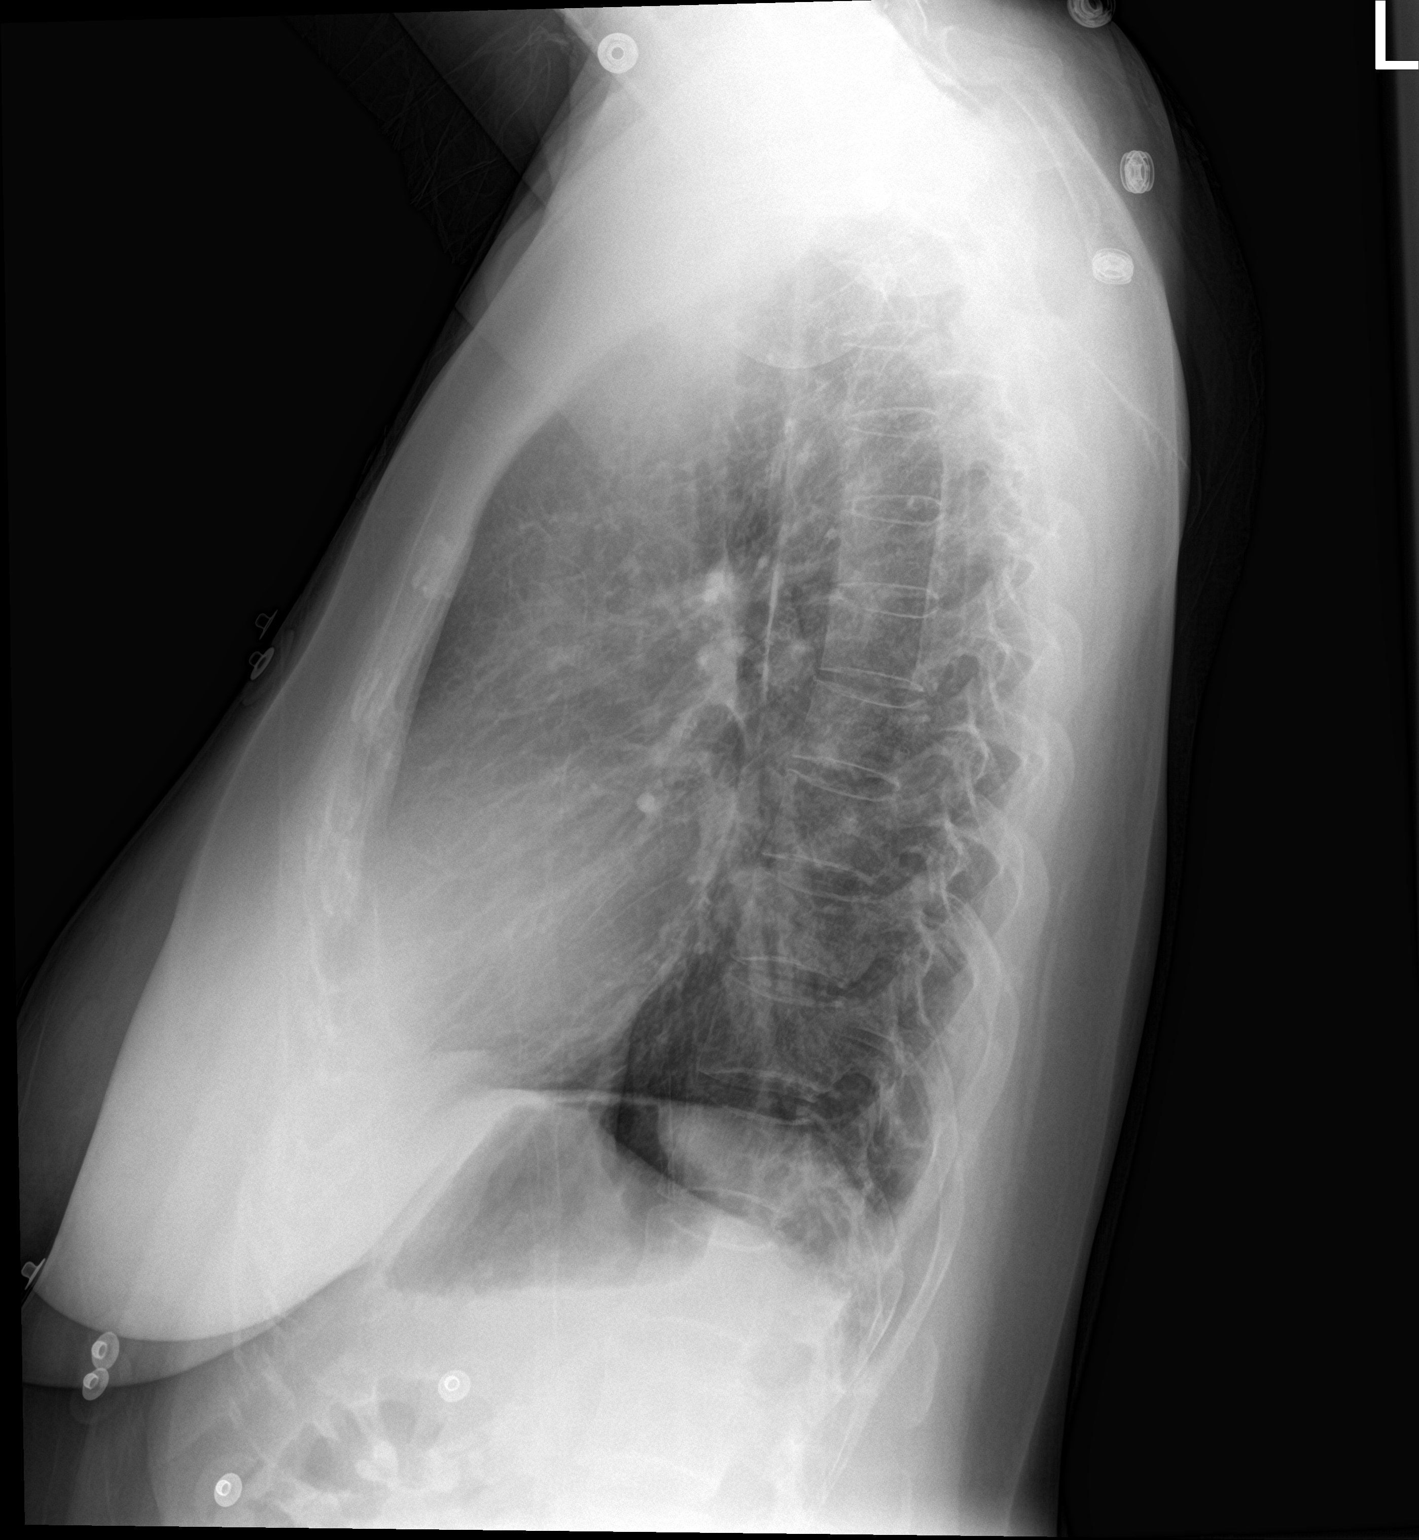

[2 of 2 positions shown; findings below may reference images not displayed]

FINDINGS: The heart size and mediastinal contours are within normal limits.
Both lungs are clear. The visualized skeletal structures are
unremarkable.
IMPRESSION: No active cardiopulmonary disease.

## 2018-05-25 ENCOUNTER — Emergency Department: Payer: BLUE CROSS/BLUE SHIELD

## 2018-05-25 ENCOUNTER — Emergency Department
Admission: EM | Admit: 2018-05-25 | Discharge: 2018-05-25 | Disposition: A | Discharge status: Home / Self Care | Payer: BLUE CROSS/BLUE SHIELD | Attending: Student in an Organized Health Care Education/Training Program | Admitting: Student in an Organized Health Care Education/Training Program

## 2018-05-25 ENCOUNTER — Encounter: Payer: Self-pay | Admitting: Emergency Medicine

## 2018-05-25 ENCOUNTER — Other Ambulatory Visit: Payer: Self-pay

## 2018-05-25 DIAGNOSIS — Z7982 Long term (current) use of aspirin: Secondary | ICD-10-CM | POA: Insufficient documentation

## 2018-05-25 DIAGNOSIS — S99921A Unspecified injury of right foot, initial encounter: Secondary | ICD-10-CM | POA: Diagnosis present

## 2018-05-25 DIAGNOSIS — Y939 Activity, unspecified: Secondary | ICD-10-CM | POA: Diagnosis not present

## 2018-05-25 DIAGNOSIS — X509XXA Other and unspecified overexertion or strenuous movements or postures, initial encounter: Secondary | ICD-10-CM | POA: Diagnosis not present

## 2018-05-25 DIAGNOSIS — F17210 Nicotine dependence, cigarettes, uncomplicated: Secondary | ICD-10-CM | POA: Insufficient documentation

## 2018-05-25 DIAGNOSIS — S93601A Unspecified sprain of right foot, initial encounter: Secondary | ICD-10-CM | POA: Diagnosis not present

## 2018-05-25 DIAGNOSIS — Y9259 Other trade areas as the place of occurrence of the external cause: Secondary | ICD-10-CM | POA: Diagnosis not present

## 2018-05-25 DIAGNOSIS — Z79899 Other long term (current) drug therapy: Secondary | ICD-10-CM | POA: Insufficient documentation

## 2018-05-25 DIAGNOSIS — Y999 Unspecified external cause status: Secondary | ICD-10-CM | POA: Insufficient documentation

## 2018-05-25 MED ORDER — MELOXICAM 15 MG PO TABS: 15.0000 mg | ORAL_TABLET | Freq: Every day | ORAL | 0 refills | Status: DC

## 2018-05-25 NOTE — ED Provider Notes (Signed)
[facility] Emergency Department Provider Note  ____________________________________________  Time seen: Approximately 6:33 PM  I have reviewed the triage vital signs and the nursing notes.   HISTORY  Chief Complaint Foot Injury    HPI Sarah Mills is a 43 y.o. female who presents the emergency department complaining of dorsal foot pain to the right foot.  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  Patient reports that she turned she had to sidestep to miss the child and her foot became caught underneath an object.  Patient fell backwards with her foot caught underneath the object.  Patient reports that she is able to bear weight on her toes but not on the mid or heel portion of her foot.  No medications prior to arrival.  Patient reports that she does have a history of an ankle fracture to this ankle "many years ago."  Patient denies any other injury or complaint.    Past Medical History:  Diagnosis Date  . Headache   . 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  . Tobacco abuse     Patient Active Problem List   Diagnosis Date Noted  . Demand ischemia ([place]) 01/07/2016  . Tobacco use 01/06/2016  . Takotsubo syndrome     Past Surgical History:  Procedure Laterality Date  . CARDIAC CATHETERIZATION N/A 01/06/2016   Procedure: Left Heart Cath and Coronary Angiography;  Surgeon: Burnell Blanks, MD;  Location: [place] [facility];  Service: Cardiovascular;  Laterality: N/A;  . TUBAL LIGATION      Prior to Admission medications   Medication Sig Start Date End Date Taking? Authorizing Provider  acetaminophen (TYLENOL) 500 MG tablet Take 1,000 mg by mouth every 6 (six) hours as needed.    [provider]  aspirin EC 81 MG EC tablet Take 1 tablet (81 mg total) by mouth daily. 01/08/16   Erlene Quan, PA-C  atorvastatin (LIPITOR) 10 MG tablet Take 1 tablet (10 mg total) by mouth daily at 6 PM. 01/08/16   Kilroy, Doreene Burke, PA-C  carvedilol (COREG) 3.125 MG tablet Take 1 tablet (3.125 mg total) by mouth 2 (two) times daily with a meal. 01/08/16   Kilroy, Doreene Burke, PA-C  gentamicin (GARAMYCIN) 0.3 % ophthalmic solution Place 1 drop into both eyes every 4 (four) hours. 06/02/17   Sable Feil, PA-C  lisinopril (PRINIVIL,ZESTRIL) 2.5 MG tablet Take 1 tablet (2.5 mg total) by mouth daily. 01/17/16   End, Harrell Gave, MD  meloxicam (MOBIC) 15 MG tablet Take 1 tablet (15 mg total) by mouth daily. 05/25/18   Yohanna Tow, Charline Bills, PA-C  naphazoline-pheniramine (NAPHCON-A) 0.025-0.3 % ophthalmic solution Place 1 drop into both eyes 4 (four) times daily as needed for eye irritation. 06/02/17   Sable Feil, PA-C  nitroGLYCERIN (NITROSTAT) 0.4 MG SL tablet Place 1 tablet (0.4 mg total) under the tongue every 5 (five) minutes x 3 doses as needed for chest pain. 01/08/16   Erlene Quan, PA-C    Allergies Patient has no known allergies.  Family History  Problem Relation Age of Onset  . CAD Father   . Heart disease Father        Pacemaker and ICD  . Heart attack Father 59  . CAD Maternal Grandfather     Social History Social History   Tobacco Use  .  Smoking status: Current Every Day Smoker    Packs/day: 0.25    Years: 28.00    Pack years: 7.00    Types: Cigarettes  . Smokeless tobacco: Never Used  Substance Use Topics  . Alcohol use: No  . Drug use: No     Review of Systems  Constitutional: No fever/chills Eyes: No visual changes.  Cardiovascular: no chest pain. Respiratory: no cough. No SOB. Gastrointestinal: No abdominal pain.  No nausea, no vomiting.  Musculoskeletal: Positive for right foot injury/pain Skin: Negative for rash, abrasions, lacerations, ecchymosis. Neurological: Negative for headaches, focal weakness or numbness. 10-point ROS otherwise  negative.  ____________________________________________   PHYSICAL EXAM:  VITAL SIGNS: ED Triage Vitals  Enc Vitals Group     BP 05/25/18 1715 (!) 159/99     Pulse Rate 05/25/18 1715 80     Resp 05/25/18 1715 18     Temp 05/25/18 1715 98.4 F (36.9 C)     Temp src --      SpO2 05/25/18 1715 100 %     Weight 05/25/18 1642 150 lb (68 kg)     Height 05/25/18 1642 5\' 9"  (1.753 m)     Head Circumference --      Peak Flow --      Pain Score 05/25/18 1642 8     Pain Loc --      Pain Edu? --      Excl. in [place]? --      Constitutional: Alert and oriented. Well appearing and in no acute distress. Eyes: Conjunctivae are normal. PERRL. EOMI. Head: Atraumatic. Neck: No stridor.    Cardiovascular: Normal rate, regular rhythm. Normal S1 and S2.  Good peripheral circulation. Respiratory: Normal respiratory effort without tachypnea or retractions. Lungs CTAB. Good air entry to the bases with no decreased or absent breath sounds. Musculoskeletal: Full range of motion to all extremities. No gross deformities appreciated.  Visualization of the right foot reveals mild edema over the talonavicular joint line.  No other visible abnormality to the right foot.  Patient is able to extend and flex the foot appropriately.  Dorsalis pedis pulse intact.  Sensation intact.  Patient is tender to palpation along the talonavicular joint line with no palpable abnormality.  No tenderness to palpation of the osseous structures of the right foot. Neurologic:  Normal speech and language. No gross focal neurologic deficits are appreciated.  Skin:  Skin is warm, dry and intact. No rash noted. Psychiatric: Mood and affect are normal. Speech and behavior are normal. Patient exhibits appropriate insight and judgement.   ____________________________________________   LABS (all labs ordered are listed, but only abnormal results are displayed)  Labs Reviewed - No data to  display ____________________________________________  EKG   ____________________________________________  RADIOLOGY I personally viewed and evaluated these images as part of my medical decision making, as well as reviewing the written report by the radiologist.  I concur with radiologist finding of no acute osseous abnormality to the right foot  Dg Foot Complete Right  Result Date: 05/25/2018 CLINICAL DATA:  43 year old female with right foot pain, swelling and erythema. EXAM: RIGHT FOOT COMPLETE - 3+ VIEW COMPARISON:  None. FINDINGS: There is no evidence of fracture or dislocation. There is no evidence of arthropathy or other focal bone abnormality. Soft tissues are unremarkable. IMPRESSION: Negative. Electronically Signed   By: Jacqulynn Cadet M.D.   On: 05/25/2018 17:14    ____________________________________________    PROCEDURES  Procedure(s) performed:    .Splint Application Date/Time:  05/25/2018 6:46 PM Performed by: Darletta Moll, PA-C Authorized by: Darletta Moll, PA-C   Consent:    Consent obtained:  Verbal   Consent given by:  Patient   Risks discussed:  Pain Pre-procedure details:    Sensation:  Normal Procedure details:    Laterality:  Right   Location:  Foot   Foot:  R foot   Splint type:  Ankle stirrup   Supplies:  Prefabricated splint Post-procedure details:    Pain:  Improved   Sensation:  Normal   Patient tolerance of procedure:  Tolerated well, no immediate complications      Medications - No data to display   ____________________________________________   INITIAL IMPRESSION / ASSESSMENT AND PLAN / ED COURSE  Pertinent labs & imaging results that were available during my care of the patient were reviewed by me and considered in my medical decision making (see chart for details).  Review of the [place] CSRS was performed in accordance of the [place] prior to dispensing any controlled drugs.      Patient's diagnosis is  consistent with right foot sprain.  Patient presents emergency department complaining of right foot pain after injury.  X-ray reveals no acute osseous abnormality.  Exam is reassuring with no indication of acute ligamentous rupture.  Patient is neurovascularly intact to the foot.  Patient is given ankle stirrup splint and crutches for symptom relief.  Meloxicam for additional symptom relief at home.  Follow-up with podiatry if symptoms do not improve..  Patient is given ED precautions to return to the ED for any worsening or new symptoms.     ____________________________________________  FINAL CLINICAL IMPRESSION(S) / ED DIAGNOSES  Final diagnoses:  Sprain of right foot, initial encounter      NEW MEDICATIONS STARTED DURING THIS VISIT:  ED Discharge Orders         Ordered    meloxicam (MOBIC) 15 MG tablet  Daily     05/25/18 1845              This chart was dictated using voice recognition software/Dragon. Despite best efforts to proofread, errors can occur which can change the meaning. Any change was purely unintentional.    Darletta Moll, PA-C 05/25/18 1847    Merlyn Lot, MD 05/25/18 2320

## 2018-05-25 NOTE — ED Triage Notes (Signed)
Attempted to contact the name on worker comp profile and that person no longer works there.  [address] is the contact but is not available.  Explained to the senior pastor Dudley Major that drug test says to be completed on request.  He is the Senior pastor and said to complete a drug test for the worker comp profile.

## 2018-05-25 NOTE — ED Triage Notes (Signed)
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  Tripped over baby. Pain to top of right foot.  No deformity

## 2019-10-20 ENCOUNTER — Emergency Department
Admission: EM | Admit: 2019-10-20 | Discharge: 2019-10-20 | Disposition: A | Discharge status: Home / Self Care | Payer: BC Managed Care – PPO | Attending: Emergency Medicine | Admitting: Emergency Medicine

## 2019-10-20 ENCOUNTER — Emergency Department: Payer: BC Managed Care – PPO

## 2019-10-20 ENCOUNTER — Other Ambulatory Visit: Payer: BC Managed Care – PPO

## 2019-10-20 ENCOUNTER — Other Ambulatory Visit: Payer: Self-pay

## 2019-10-20 DIAGNOSIS — Z7982 Long term (current) use of aspirin: Secondary | ICD-10-CM | POA: Insufficient documentation

## 2019-10-20 DIAGNOSIS — I252 Old myocardial infarction: Secondary | ICD-10-CM | POA: Insufficient documentation

## 2019-10-20 DIAGNOSIS — Z79899 Other long term (current) drug therapy: Secondary | ICD-10-CM | POA: Diagnosis not present

## 2019-10-20 DIAGNOSIS — N938 Other specified abnormal uterine and vaginal bleeding: Secondary | ICD-10-CM | POA: Diagnosis not present

## 2019-10-20 DIAGNOSIS — N939 Abnormal uterine and vaginal bleeding, unspecified: Secondary | ICD-10-CM | POA: Diagnosis present

## 2019-10-20 DIAGNOSIS — F17210 Nicotine dependence, cigarettes, uncomplicated: Secondary | ICD-10-CM | POA: Insufficient documentation

## 2019-10-20 LAB — CBC
HCT: 37.5 % (ref 36.0–46.0)
Hemoglobin: 12.5 g/dL (ref 12.0–15.0)
MCH: 30.9 pg (ref 26.0–34.0)
MCHC: 33.3 g/dL (ref 30.0–36.0)
MCV: 92.8 fL (ref 80.0–100.0)
Platelets: 290 10*3/uL (ref 150–400)
RBC: 4.04 MIL/uL (ref 3.87–5.11)
RDW: 13.1 % (ref 11.5–15.5)
WBC: 6.6 10*3/uL (ref 4.0–10.5)
nRBC: 0 % (ref 0.0–0.2)

## 2019-10-20 LAB — ABO/RH: ABO/RH(D): A POS

## 2019-10-20 LAB — HCG, QUANTITATIVE, PREGNANCY: hCG, Beta Chain, Quant, S: <1 m[IU]/mL (ref <5)

## 2019-10-20 NOTE — ED Notes (Signed)
Ultrasound at bedside to get patient.

## 2019-10-20 NOTE — ED Provider Notes (Signed)
[facility] Emergency Department Provider Note  Time seen: 12:41 PM  I have reviewed the triage vital signs and the nursing notes.   HISTORY  Chief Complaint Vaginal Bleeding   HPI Sarah Mills is a 44 y.o. female with a past medical history of an NSTEMI presents to the emergency department for vaginal bleeding.  According to the patient since January she has been experiencing vaginal bleeding.  States she finally called an OB to make an appointment but could not be seen for another 3 weeks.  States vaginal bleeding increased somewhat today so she came to the emergency department for evaluation.  Patient denies abdominal pain.  Denies lightheadedness or dizziness.  Past Medical History:  Diagnosis Date  . Headache   . 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  . Tobacco abuse     Patient Active Problem List   Diagnosis Date Noted  . Demand ischemia ([place]) 01/07/2016  . Tobacco use 01/06/2016  . Takotsubo syndrome     Past Surgical History:  Procedure Laterality Date  . CARDIAC CATHETERIZATION N/A 01/06/2016   Procedure: Left Heart Cath and Coronary Angiography;  Surgeon: Burnell Blanks, MD;  Location: [place] [facility];  Service: Cardiovascular;  Laterality: N/A;  . TUBAL LIGATION      Prior to Admission medications   Medication Sig Start Date End Date Taking? Authorizing Provider  acetaminophen (TYLENOL) 500 MG tablet Take 1,000 mg by mouth every 6 (six) hours as needed.    [provider]  aspirin EC 81 MG EC tablet Take 1 tablet (81 mg total) by mouth daily. 01/08/16   Erlene Quan, PA-C  atorvastatin (LIPITOR) 10 MG tablet Take 1 tablet (10 mg total) by mouth daily at 6 PM. 01/08/16   Kilroy, Doreene Burke, PA-C  carvedilol (COREG) 3.125 MG tablet Take 1 tablet (3.125 mg total) by mouth 2 (two) times daily with a meal. 01/08/16   Kilroy, Doreene Burke, PA-C  gentamicin  (GARAMYCIN) 0.3 % ophthalmic solution Place 1 drop into both eyes every 4 (four) hours. 06/02/17   Sable Feil, PA-C  lisinopril (PRINIVIL,ZESTRIL) 2.5 MG tablet Take 1 tablet (2.5 mg total) by mouth daily. 01/17/16   End, Harrell Gave, MD  meloxicam (MOBIC) 15 MG tablet Take 1 tablet (15 mg total) by mouth daily. 05/25/18   Cuthriell, Charline Bills, PA-C  naphazoline-pheniramine (NAPHCON-A) 0.025-0.3 % ophthalmic solution Place 1 drop into both eyes 4 (four) times daily as needed for eye irritation. 06/02/17   Sable Feil, PA-C  nitroGLYCERIN (NITROSTAT) 0.4 MG SL tablet Place 1 tablet (0.4 mg total) under the tongue every 5 (five) minutes x 3 doses as needed for chest pain. 01/08/16   Erlene Quan, PA-C    No Known Allergies  Family History  Problem Relation Age of Onset  . CAD Father   . Heart disease Father        Pacemaker and ICD  . Heart attack Father 31  . CAD Maternal Grandfather     Social History Social History   Tobacco Use  . Smoking status: Current Every Day Smoker    Packs/day: 0.25    Years: 28.00    Pack years: 7.00    Types: Cigarettes  . Smokeless tobacco: Never Used  Substance Use Topics  . Alcohol use: No  . Drug use: No    Review of Systems Constitutional: Negative for fever. Cardiovascular: Negative for chest  pain. Respiratory: Negative for shortness of breath. Gastrointestinal: Negative for abdominal pain Genitourinary: Vaginal bleeding x6 months. Musculoskeletal: Negative for musculoskeletal complaints Neurological: Negative for headache All other ROS negative  ____________________________________________   PHYSICAL EXAM:  VITAL SIGNS: ED Triage Vitals  Enc Vitals Group     BP 10/20/19 0843 (!) 148/92     Pulse Rate 10/20/19 0843 86     Resp 10/20/19 0843 16     Temp 10/20/19 0843 98.3 F (36.8 C)     Temp Source 10/20/19 0843 Oral     SpO2 10/20/19 0843 100 %     Weight 10/20/19 0841 250 lb (113.4 kg)     Height 10/20/19 0841 5\' 9"   (1.753 m)     Head Circumference --      Peak Flow --      Pain Score --      Pain Loc --      Pain Edu? --      Excl. in [place]? --    Constitutional: Alert and oriented. Well appearing and in no distress. Eyes: Normal exam ENT      Head: Normocephalic and atraumatic.      Mouth/Throat: Mucous membranes are moist. Cardiovascular: Normal rate, regular rhythm. Respiratory: Normal respiratory effort without tachypnea nor retractions. Breath sounds are clear Gastrointestinal: Soft and nontender. No distention Musculoskeletal: Nontender with normal range of motion in all extremities. Neurologic:  Normal speech and language. No gross focal neurologic deficits  Skin:  Skin is warm, dry and intact.  Psychiatric: Mood and affect are normal.      RADIOLOGY  Ultrasound is largely negative.  ____________________________________________   INITIAL IMPRESSION / ASSESSMENT AND PLAN / ED COURSE  Pertinent labs & imaging results that were available during my care of the patient were reviewed by me and considered in my medical decision making (see chart for details).   Patient presents to the emergency department for vaginal bleeding ongoing for the past 6 months.  States somewhat worse recently which is what prompted today's ER visit.  Patient called an OB but cannot be seen for 3 weeks.  States that has been quite a long time since her last OB appointment.  Differential is quite broad at this point but would include uterine fibroids, dysfunctional uterine bleeding, tumor mass or oncologic process, hormonal imbalance.  Patient's labs are reassuringly normal hemoglobin largely unchanged from prior results.  Pregnancy test pending.  Ultrasound pending.  Ultrasound largely negative.  Pregnancy test negative.  Patient is reluctant to start OCPs as she states there is a family history of blood clot and she is a daily smoker which is a concern/consideration.  Patient has an appointment with OB in 2 to 3  weeks.  Patient's blood counts are stable.  Given the stability of the patient's blood counts and duration of bleeding greater than 6 months I believe the patient is stable to wait an additional 2 to 3 weeks to speak to OB/GYN before starting hormonal therapies.  I discussed return precautions.  Patient agreeable to plan of care.  Sarah Mills was evaluated in Emergency Department on 10/20/2019 for the symptoms described in the history of present illness. She was evaluated in the context of the global COVID-19 pandemic, which necessitated consideration that the patient might be at risk for infection with the SARS-CoV-2 virus that causes COVID-19. Institutional protocols and algorithms that pertain to the evaluation of patients at risk for COVID-19 are in a state of rapid change based on  information released by regulatory bodies including the CDC and federal and state organizations. These policies and algorithms were followed during the patient's care in the ED.  ____________________________________________   FINAL CLINICAL IMPRESSION(S) / ED DIAGNOSES  Dysfunctional uterine bleeding   Harvest Dark, MD 10/20/19 1453

## 2019-10-20 NOTE — ED Triage Notes (Addendum)
Pt comes via POV from home with c/o vaginal bleeding that has been going on for months. Pt states appt with OBGYN but it got worse today.  Pt states large clots, heavy bleeding and no belly pain.

## 2019-10-20 NOTE — ED Notes (Signed)
Pt given meal tray and sprite to drink. MD Paduchowski aware.

## 2019-10-20 NOTE — ED Notes (Addendum)
This RN entered room and patient began yelling voice and cursing at this RN. Patient states "this is fucking ridiculous, I have been waiting here since 830 this morning and have heard nothing." This RN apologized for long wait times and explained that the emergency room is extremely busy. This RN explained that we were waiting on pregnancy level so that ultrasound could complete test. Patient states "I have had urine sitting on that counter for fucking hour". This RN verbalized understanding of frustration, butexplained to patient to stop cussing at staff. Patient states "I'm not going to stop cussing at staff, because you are fucking useless. I should have went to [facility]". This rn explained that patient can sign AMA if she wishes to leave at any time. Pt states "My kids keep fucking calling me to check on , and I dont have anything to fucking tell them. I cant have any visitors here and its fucking bullshit. I have to have someone with me at all times". This Rn explained that visitation policy only allows for one visitor at this time. Pt becoming increasingly loud with this RN at this point and stating "I'm gonna tell you what if I'm still waiting here at 5pm theres going to be a problem. I havent eaten all day and I cant even have anyone with me back here. I need something to fucking tell my kids". This RN reiterated that MD Paduchowski discussed lab results at bedside with patient which were all WNL. Pt advised that to get a better picture, MD Paduchowski had to order a US pelvic. Pt advised that ultrasound will come as soon as they can, but they also have to see other patients. Patient instructed that after scans are complete, this RN can get patient something to eat. Patient then states "This is fucking stupid, if [facility] would have done their job the first time I wouldn't have to be here right now. The man told me I was the first patient here today, and that I should have been brought back  faster. I know theres no one else in this department. It's not even busy out there". This RN explained that there are other patients in the lobby and once again explained reasoning for extended wait times. Patient then stated "If I'm not gone by 3pm, I want a fucking number to call and complain to and i'm calling my lawyer as well to sue [facility]. I'm going to get my kids to keep calling the emergency room and ask whats going on because I sure as hell dont know. I'm going to get them to keep calling and calling yall". This RN then re-explained that patient's lab work was discussed with her by MD [facility] and we are now waiting on ultrasound to come get patient. Pt advised of number she needs to call for complaints as well. This RN exited room at this time.

## 2019-10-23 ENCOUNTER — Other Ambulatory Visit (HOSPITAL_COMMUNITY)
Admission: RE | Admit: 2019-10-23 | Discharge: 2019-10-23 | Disposition: A | Discharge status: Home / Self Care | Payer: BC Managed Care – PPO | Source: Ambulatory Visit | Attending: Obstetrics and Gynecology | Admitting: Obstetrics and Gynecology

## 2019-10-23 ENCOUNTER — Other Ambulatory Visit: Payer: Self-pay

## 2019-10-23 ENCOUNTER — Encounter: Payer: Self-pay | Admitting: Obstetrics and Gynecology

## 2019-10-23 ENCOUNTER — Ambulatory Visit: Payer: BC Managed Care – PPO | Admitting: Obstetrics and Gynecology

## 2019-10-23 VITALS — BP 110/80 | Ht 69.0 in | Wt 217.0 lb

## 2019-10-23 DIAGNOSIS — N921 Excessive and frequent menstruation with irregular cycle: Secondary | ICD-10-CM | POA: Insufficient documentation

## 2019-10-23 DIAGNOSIS — N946 Dysmenorrhea, unspecified: Secondary | ICD-10-CM | POA: Diagnosis not present

## 2019-10-23 NOTE — Progress Notes (Signed)
Obstetrics & Gynecology Office Visit   Chief Complaint  Patient presents with   Follow-up    ER  heavy vaginal bleeding  History of Present Illness: 44 y.o. G31P3003 female who presents for heavy, irregular menses.  This problem started in February.  Prior to February her periods would come monthly, lasted 4-5 days.  The first couple of days were heavy, then they became lighter.  Since February she has bleeding every day. Some days are lighter with passes blood clots. Other days she has a steady heavy flow.  She passes blood clots every day.  On the heaviest days she is changing a pad every 1/2 hour.  Sometimes she changes pads every hour.  As an example, last Wednesday she had a huge gush that ran down her leg and this was around her pad.   She states that it has been a while since her last pap smear.  Her last one, she states, was normal.   She has never had a mammogram.  She notes no weight changes. She has cramps. She denies bloating.  She does have some early satiety. Denies constipation.  She went to the ER on 6/11 and had normal labs and an essentially normal pelvic ultrasound.   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   Tobacco abuse     Past Surgical History:  Procedure Laterality Date   CARDIAC CATHETERIZATION N/A 01/06/2016   Procedure: Left Heart Cath and Coronary Angiography;  Surgeon: Burnell Blanks, MD;  Location: [place] [facility] [facility];  Service: Cardiovascular;  Laterality: N/A;   TUBAL LIGATION      Gynecologic History: Patient's last menstrual period was 10/20/2019.  Obstetric History: [id number], s/p SVD x 3 (age 30, 86, 81)  Family History  Problem Relation Age of Onset   CAD Father    Heart disease Father        Pacemaker and ICD   Heart attack Father 46   CAD Maternal Grandfather     Social History   Socioeconomic  History   Marital status: Single    Spouse name: Not on file   Number of children: Not on file   Years of education: Not on file   Highest education level: Not on file  Occupational History   Not on file  Tobacco Use   Smoking status: Current Every Day Smoker    Packs/day: 0.25    Years: 28.00    Pack years: 7.00    Types: Cigarettes   Smokeless tobacco: Never Used  Vaping Use   Vaping Use: Never used  Substance and Sexual Activity   Alcohol use: No   Drug use: No   Sexual activity: Not Currently    Birth control/protection: None  Other Topics Concern   Not on file  Social History Narrative   Not on file   Social Determinants of Health   Financial Resource Strain:    Difficulty of Paying Living Expenses:   Food Insecurity:    Worried About Charity fundraiser in the Last Year:    Arboriculturist in the Last Year:   Transportation Needs:    Film/video editor (Medical):    Lack of Transportation (Non-Medical):   Physical Activity:    Days of Exercise per Week:    Minutes of Exercise per Session:   Stress:    Feeling of  Stress :   Social Connections:    Frequency of Communication with Friends and Family:    Frequency of Social Gatherings with Friends and Family:    Attends Religious Services:    Active Member of Clubs or Organizations:    Attends Archivist Meetings:    Marital Status:   Intimate Partner Violence:    Fear of Current or Ex-Partner:    Emotionally Abused:    Physically Abused:    Sexually Abused:    Allergies: No Known Allergies  Prior to Admission medications   Medication Sig Start Date End Date Taking? Authorizing Provider  acetaminophen (TYLENOL) 500 MG tablet Take 1,000 mg by mouth every 6 (six) hours as needed.   Yes [provider]    Review of Systems  Constitutional: Positive for malaise/fatigue. Negative for chills, diaphoresis, fever and weight loss.       Hot flashes  HENT:  Negative.   Eyes: Negative.   Respiratory: Negative.   Cardiovascular: Negative.   Gastrointestinal: Negative.   Genitourinary: Negative.        See HPI  Musculoskeletal: Negative.   Skin: Negative.   Neurological: Negative.   Psychiatric/Behavioral: Negative.      Physical Exam BP 110/80    Ht 5\' 9"  (1.753 m)    Wt 217 lb (98.4 kg)    LMP 10/20/2019    BMI 32.05 kg/m  Patient's last menstrual period was 10/20/2019. Physical Exam Constitutional:      General: She is not in acute distress.    Appearance: Normal appearance. She is well-developed.  Genitourinary:     Pelvic exam was performed with patient in the lithotomy position.     Vulva, inguinal canal, urethra, bladder, vagina, uterus, right adnexa and left adnexa normal.     No posterior fourchette tenderness, injury or lesion present.     Cervical nabothian cyst present.     No cervical friability, lesion, bleeding or polyp.     Cervical exam comments: Cervix firm to palpation Cells collected for cytology.   HENT:     Head: Normocephalic and atraumatic.  Eyes:     General: No scleral icterus.    Conjunctiva/sclera: Conjunctivae normal.  Cardiovascular:     Rate and Rhythm: Normal rate and regular rhythm.     Heart sounds: No murmur heard.  No friction rub. No gallop.   Pulmonary:     Effort: Pulmonary effort is normal. No respiratory distress.     Breath sounds: Normal breath sounds. No wheezing or rales.  Abdominal:     General: Bowel sounds are normal. There is no distension.     Palpations: Abdomen is soft. There is no mass.     Tenderness: There is no abdominal tenderness. There is no guarding or rebound.  Musculoskeletal:        General: Normal range of motion.     Cervical back: Normal range of motion and neck supple.  Neurological:     General: No focal deficit present.     Mental Status: She is alert and oriented to person, place, and time.     Cranial Nerves: No cranial nerve deficit.  Skin:     General: Skin is warm and dry.     Findings: No erythema.  Psychiatric:        Mood and Affect: Mood normal.        Behavior: Behavior normal.        Judgment: Judgment normal.   Endometrial Biopsy After discussion  with the patient regarding her abnormal uterine bleeding I recommended that she proceed with an endometrial biopsy for further diagnosis. The risks, benefits, alternatives, and indications for an endometrial biopsy were discussed with the patient in detail. She understood the risks including infection, bleeding, cervical laceration and uterine perforation.  Verbal consent was obtained.   PROCEDURE NOTE:  Pipelle endometrial biopsy was performed using aseptic technique with iodine preparation.  The uterus was sounded to a length of 8 cm.  Adequate sampling was obtained with minimal blood loss.  The patient tolerated the procedure well.  Disposition will be pending pathology.  Female chaperone present for pelvic and breast  portions of the physical exam  Assessment: 44 y.o. G67P3003 female here for  1. Menorrhagia with irregular cycle   2. Dysmenorrhea      Plan: Problem List Items Addressed This Visit    None    Visit Diagnoses    Menorrhagia with irregular cycle    -  Primary   Relevant Orders   Cytology - PAP   Surgical pathology   Dysmenorrhea       Relevant Orders   Surgical pathology     Discussed management options for abnormal uterine bleeding including expectant, NSAIDs, tranexamic acid (Lysteda), oral progesterone (Provera, norethindrone, megace), Depo Provera, Levonorgestrel containing IUD, endometrial ablation (Novasure) or hysterectomy as definitive surgical management.  Discussed risks and benefits of each method.   Final management decision will hinge on results of patient's work up and whether an underlying etiology for the patients bleeding symptoms can be discerned.  We will conduct a basic work up examining using the PALM-COIEN classification system.   Bleeding precautions reviewed.  Discussed specifically no treatment, treatment with hormonal medication (no estrogen), and surgery with endometrial ablation versus hysterectomy. Will discuss further once cervical and endometrial biopsy results return.   A total of 45 minutes were spent face-to-face with the patient as well as preparation, review, communication, and documentation during this encounter.     Prentice Docker, MD 10/23/2019 5:50 PM

## 2019-10-25 LAB — CYTOLOGY - PAP
Chlamydia: NEGATIVE
Comment: NEGATIVE
Comment: NEGATIVE
Comment: NEGATIVE
Comment: NORMAL
Diagnosis: NEGATIVE
High risk HPV: NEGATIVE
Neisseria Gonorrhea: NEGATIVE
Trichomonas: NEGATIVE

## 2019-10-25 LAB — SURGICAL PATHOLOGY

## 2019-10-27 ENCOUNTER — Telehealth: Payer: Self-pay | Admitting: Obstetrics and Gynecology

## 2019-10-27 NOTE — Telephone Encounter (Signed)
Discussed findings. Reviewed treatment options again (done previously in clinic). Patient elects hysterectomy.  She will need cardiac clearance. She was informed about this. Surgery scheduling form submitted.

## 2019-11-02 ENCOUNTER — Telehealth: Payer: Self-pay | Admitting: Obstetrics and Gynecology

## 2019-11-02 ENCOUNTER — Telehealth: Payer: Self-pay

## 2019-11-02 NOTE — Telephone Encounter (Signed)
Pt calling; is in the process of getting ready for a hyst; SDJ needs cardiologist approval; she called Dr. Saunders Revel and was told SDJ needs to contact Dr. Saunders Revel for that approval.  [phone]

## 2019-11-02 NOTE — Telephone Encounter (Signed)
Looked at provider schedule for next 2 weeks. Opening with Laurann Montana, NP on 11/14/19 at 11:30 am.  Hold placed on that appointment.  Attempted to reach patient but no answer.  No answer. Left message to call back.   If any opening with Dr End or another APP available that's ok as well.

## 2019-11-02 NOTE — Telephone Encounter (Signed)
I returned call to pt to schedule TLH/BS with Kendra Opitz 7/8  H&P 7/2 @ 11:30   Covid testing 7/6 @ 8-10:30, [address], drive up and wear mask. Advised pt to quarantine until DOS.  Pre-admit phone call appointment to be requested - date and time will be included on H&P paper work. Also all appointments will be updated on pt MyChart. Explained that this appointment has a call window. Based on the time scheduled will indicate if the call will be received within a 4 hour window before 1:00 or after.  Advised that pt may also receive calls from the hospital pharmacy and pre-service center.  Confirmed pt has BCBS as Chartered certified accountant. No secondary insurance at this time but pt informed that she applied for Medicaid as secondary on 11/01/19 and hopes to know something by first of next week. I asked that she please let me know as we will need her to sign a consent for the hysterectomy. I will include that with the H&P paperwork to be signed in case it's needed.  Also Dr Glennon Mac has requested cardiac clearance from Dr. Saunders Revel (will be copied on this encounter).

## 2019-11-02 NOTE — Telephone Encounter (Signed)
It has been more than 3 years since the patient was seen in our practice and is therefor no longer an established patient.  New referral will need to be placed if cardiac clearance is needed.  Thanks.  Nelva Bush, MD [facility]

## 2019-11-02 NOTE — Telephone Encounter (Signed)
-----   Message from Will Bonnet, MD sent at 10/27/2019  5:39 PM EDT ----- Regarding: Schedule surgery Surgery Booking Request Patient Full Name:  Sarah Mills  MRN: [id number]  DOB: 09/02/1975  Surgeon: Prentice Docker, MD  Requested Surgery Date and Time: TBD Primary Diagnosis AND Code:  1) Menorrhagia with irregular cyce [N92.1] 2) Dysmenorrhea [N94.6] Secondary Diagnosis and Code:  Surgical Procedure: TLH/BS/Cysto L&D Notification: No Admission Status: same day surgery Length of Surgery: 2 hours Special Case Needs: No H&P: Yes Phone Interview???:  No Interpreter: No Language:  Medical Clearance:  Yes. Patient will need cardiac clearance.  Special Scheduling Instructions: none Any known health/anesthesia issues, diabetes, sleep apnea, latex allergy, defibrillator/pacemaker?: No Acuity: P2   (P1 highest, P2 delay may cause harm, P3 low, elective gyn, P4 lowest)

## 2019-11-03 ENCOUNTER — Other Ambulatory Visit: Payer: Self-pay

## 2019-11-03 ENCOUNTER — Ambulatory Visit (INDEPENDENT_AMBULATORY_CARE_PROVIDER_SITE_OTHER): Payer: BC Managed Care – PPO | Admitting: Nurse Practitioner

## 2019-11-03 ENCOUNTER — Encounter: Payer: Self-pay | Admitting: Nurse Practitioner

## 2019-11-03 VITALS — BP 134/98 | HR 82 | Ht 69.0 in | Wt 221.4 lb

## 2019-11-03 DIAGNOSIS — I5181 Takotsubo syndrome: Secondary | ICD-10-CM | POA: Diagnosis not present

## 2019-11-03 DIAGNOSIS — Z720 Tobacco use: Secondary | ICD-10-CM | POA: Diagnosis not present

## 2019-11-03 DIAGNOSIS — Z01810 Encounter for preprocedural cardiovascular examination: Secondary | ICD-10-CM

## 2019-11-03 MED ORDER — ATORVASTATIN CALCIUM 40 MG PO TABS: 40.0000 mg | ORAL_TABLET | Freq: Every day | ORAL | 3 refills | Status: DC

## 2019-11-03 MED ORDER — CARVEDILOL 3.125 MG PO TABS: 3.1250 mg | ORAL_TABLET | Freq: Two times a day (BID) | ORAL | 3 refills | Status: DC

## 2019-11-03 NOTE — Patient Instructions (Signed)
Medication Instructions:  1- RESTART Carvedilol Take 1 tablet (3.125 mg total) by mouth 2 (two) times daily 2- RESTART Lipitor Take 1 tablet (40 mg total) by mouth daily *If you need a refill on your cardiac medications before your next appointment, please call your pharmacy*   Lab Work: Your physician recommends that you return for lab work in: 6 weeks at the medical mall. You will need to be fasting. (Lipid and LFT) No appt is needed. Hours are M-F 7AM- 6 PM.  If you have labs (blood work) drawn today and your tests are completely normal, you will receive your results only by: Marland Kitchen MyChart Message (if you have MyChart) OR . A paper copy in the mail If you have any lab test that is abnormal or we need to change your treatment, we will call you to review the results.   Testing/Procedures: None ordered    Follow-Up: At [facility], you and your health needs are our priority.  As part of our continuing mission to provide you with exceptional heart care, we have created designated Provider Care Teams.  These Care Teams include your primary Cardiologist (physician) and Advanced Practice Providers (APPs -  Physician Assistants and Nurse Practitioners) who all work together to provide you with the care you need, when you need it.  We recommend signing up for the patient portal called "MyChart".  Sign up information is provided on this After Visit Summary.  MyChart is used to connect with patients for Virtual Visits (Telemedicine).  Patients are able to view lab/test results, encounter notes, upcoming appointments, etc.  Non-urgent messages can be sent to your provider as well.   To learn more about what you can do with MyChart, go to [email or web address].    Your next appointment:   6 month(s)  The format for your next appointment:   In Person  Provider:    You may see Nelva Bush, MD or Murray Hodgkins, NP

## 2019-11-03 NOTE — Progress Notes (Signed)
Cardiology Clinic Note   Patient Name: Sarah Mills Date of Encounter: 11/03/2019  Primary Care Provider:  System, Pcp Not In Primary Cardiologist:  Nelva Bush, MD  Patient Profile    44 year old female with a history of Takotsubo cardiomyopathy, minimal nonobstructive CAD, and tobacco abuse, who presents for preoperative evaluation pending hysterectomy in the setting of menorrhagia and dysmenorrhea.  Past Medical History    Past Medical History:  Diagnosis Date  . Dysmenorrhea   . Headache   . IBS (irritable bowel syndrome)   . Menorrhagia   . NSTEMI (non-ST elevated myocardial infarction) ([place])    a. 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  . Obesity   . Takotsubo cardiomyopathy    a. 12/2015 Cath: Nl cors - ? plaque in small RPDA.  EF 25-35%; b. 12/2015 Echo: EF 40-45%; c. 04/2016 Echo: EF 55-60%, no rwma. Nl RV fxn.  . Tobacco abuse    Past Surgical History:  Procedure Laterality Date  . CARDIAC CATHETERIZATION N/A 01/06/2016   Procedure: Left Heart Cath and Coronary Angiography;  Surgeon: Burnell Blanks, MD;  Location: [place] [facility];  Service: Cardiovascular;  Laterality: N/A;  . TUBAL LIGATION      Allergies  No Known Allergies  History of Present Illness    44 year old female with the above past medical history including Takotsubo cardiomyopathy, minimal nonobstructive CAD, tobacco abuse, menorrhagia, and dysmenorrhea.  Cardiac history dates back to August 2017, when she presented to [facility] following sudden onset of chest pressure associate with dyspnea and diaphoresis.  Initial troponin was elevated and ECG was notable for new T wave inversion in leads V3 through V5.  She was admitted for management of non-STEMI and underwent diagnostic catheterization revealing minimal plaque in the RPDA and otherwise normal coronary arteries.  EF was 25 to 35% by ventriculography and 40-45% by echocardiography.  She was placed on  medical therapy including aspirin, statin, and beta-blocker.  ACE inhibitor therapy was later added in the outpatient setting.  She was doing well at follow-up and echocardiogram was performed again in December 2017 showing an EF of 55 to 60% without regional wall motion abnormalities.  She has not been seen in cardiology clinic since September 2017.  Since her last cardiology visit in September 2017, she has done well from a cardiac standpoint.  She continues to work at a daycare and also walks her dog daily with her granddaughter and denies any chest pain or dyspnea.  Further, she denies palpitations, PND, orthopnea, dizziness, syncope, edema, or early satiety.  She says she ran out of her medications in early 2018 and was not able to afford refills.  As a result, she has not been on any of her cardiac medications.  She now has insurance with her work and would be willing to resume.  She continues to smoke 1 to 2 cigarettes/day.  Unfortunately, she has been suffering with dysmenorrhea and menorrhagia and has been followed by OB/GYN with a plan for hysterectomy in early July.  Home Medications    Prior to Admission medications   Medication Sig Start Date End Date Taking? Authorizing Provider  acetaminophen (TYLENOL) 500 MG tablet Take 1,000 mg by mouth every 6 (six) hours as needed.   Yes [provider]    Family History    Family History  Problem Relation Age of Onset  . CAD Father   . Heart disease Father        Pacemaker and ICD;  died in his 66's  . Heart attack Father 23  . CAD Maternal Grandfather    She indicated that her mother is alive. She indicated that her father is deceased. She indicated that her brother is alive. She indicated that her maternal grandfather is deceased.  Social History    Social History   Socioeconomic History  . Marital status: Single    Spouse name: Not on file  . Number of children: Not on file  . Years of education: Not on file  . Highest  education level: Not on file  Occupational History  . Not on file  Tobacco Use  . Smoking status: Current Every Day Smoker    Packs/day: 0.25    Years: 28.00    Pack years: 7.00    Types: Cigarettes  . Smokeless tobacco: Never Used  . Tobacco comment: a pack lastes 4 days, pt trying to quit  Vaping Use  . Vaping Use: Never used  Substance and Sexual Activity  . Alcohol use: No  . Drug use: No  . Sexual activity: Not Currently    Birth control/protection: None  Other Topics Concern  . Not on file  Social History Narrative   Lives in [place] w/ son, dtr, and grandchild.  Works @ a [employer].  Active @ work and goes for walks with her granddtr regularly.   Social Determinants of Health   Financial Resource Strain:   . Difficulty of Paying Living Expenses:   Food Insecurity:   . Worried About Charity fundraiser in the Last Year:   . Arboriculturist in the Last Year:   Transportation Needs:   . Film/video editor (Medical):   Marland Kitchen Lack of Transportation (Non-Medical):   Physical Activity:   . Days of Exercise per Week:   . Minutes of Exercise per Session:   Stress:   . Feeling of Stress :   Social Connections:   . Frequency of Communication with Friends and Family:   . Frequency of Social Gatherings with Friends and Family:   . Attends Religious Services:   . Active Member of Clubs or Organizations:   . Attends Archivist Meetings:   Marland Kitchen Marital Status:   Intimate Partner Violence:   . Fear of Current or Ex-Partner:   . Emotionally Abused:   Marland Kitchen Physically Abused:   . Sexually Abused:      Review of Systems    General:  No chills, fever, night sweats or weight changes.  Cardiovascular:  No chest pain, dyspnea on exertion, edema, orthopnea, palpitations, paroxysmal nocturnal dyspnea. Dermatological: No rash, lesions/masses Respiratory: No cough, dyspnea Urologic: No hematuria, dysuria Abdominal:   No nausea, vomiting, diarrhea, bright red blood per rectum,  melena, or hematemesis Neurologic:  No visual changes, wkns, changes in mental status. GU: Irregular periods and persistent uterine bleeding pending hysterectomy. All other systems reviewed and are otherwise negative except as noted above.  Physical Exam    VS:  BP (!) 134/98 (BP Location: Left Arm, Patient Position: Sitting, Cuff Size: Normal)   Pulse 82   Ht 5\' 9"  (1.753 m)   Wt 221 lb 6 oz (100.4 kg)   LMP 10/20/2019   SpO2 99%   BMI 32.69 kg/m  , BMI Body mass index is 32.69 kg/m. GEN: Well nourished, well developed, in no acute distress. HEENT: normal. Neck: Supple, no JVD, carotid bruits, or masses. Cardiac: RRR, no murmurs, rubs, or gallops. No clubbing, cyanosis, edema.  Radials/DP/PT 2+ and  equal bilaterally.  Respiratory:  Respirations regular and unlabored, clear to auscultation bilaterally. GI: Soft, nontender, nondistended, BS + x 4. MS: no deformity or atrophy. Skin: warm and dry, no rash. Neuro:  Strength and sensation are intact. Psych: Normal affect.  Accessory Clinical Findings    ECG personally reviewed by me today-regular sinus rhythm, 82 - No acute changes  Lab Results  Component Value Date   WBC 6.6 10/20/2019   HGB 12.5 10/20/2019   HCT 37.5 10/20/2019   MCV 92.8 10/20/2019   PLT 290 10/20/2019   Lab Results  Component Value Date   CREATININE 0.82 01/24/2016   BUN 11 01/24/2016   NA 138 01/24/2016   K 4.3 01/24/2016   CL 98 01/24/2016   CO2 25 01/24/2016    Lab Results  Component Value Date   CHOL 150 01/07/2016   HDL 32 (L) 01/07/2016   LDLCALC 99 01/07/2016   TRIG 96 01/07/2016   CHOLHDL 4.7 01/07/2016    Lab Results  Component Value Date   HGBA1C 5.1 01/07/2016    Assessment & Plan   1.  Takotsubo cardiomyopathy: Patient was admitted in August 2017 with chest pain, dyspnea, EKG changes and abnormal troponin suggestive of acute coronary syndrome.  Catheterization revealed minimal RPDA disease and ventriculography showed an EF of  25 to 35% with wall motion abnormality suggestive of Takotsubo cardiomyopathy.  By December 2017, EF normalized on beta-blocker and low-dose ACE inhibitor therapy.  She has been out of those medications dating back to early 2018 in the setting of lack of insurance but now has insurance and would be willing to resume.  We will place her back on low-dose carvedilol 3.125 mg twice daily.  Can consider resumption of ACE inhibitor in the future.  2.  Nonobstructive CAD: Minimal RPDA disease on catheterization in 2017 in the setting of above.  She has significant family history for premature CAD with her father having multiple MIs, beginning in his 54s and eventually dying in his 65s.  She is willing to resume statin therapy and I will add Lipitor 40 mg daily with a plan to follow-up lipids and LFTs in approximately 6 weeks.  She should also begin aspirin 81 mg daily however we will hold off on initiating this as she has been having significant uterine bleeding and is now pending hysterectomy.  Would asked that she start low-dose aspirin once felt to be safe postoperatively.  3.  Dysmenorrhea/menorrhagia/preoperative cardiovascular examination: As above, patient is pending hysterectomy.  She has been doing exceptionally well over the past 3 years without chest pain or dyspnea.  She is active with good activity tolerance and a 0.9% risk of major cardiac event perioperatively.  As above, I am resuming low-dose beta-blocker and statin therapy and do recommend that she continue these medications perioperatively but she will not require any ischemic evaluation prior to surgery.  4.  Elevated blood pressure: Blood pressure 134/98 today.  Adding beta-blocker in the setting of above.  5.  Tobacco abuse: Still smoking 1 to 2 cigarettes/day.  Complete cessation advised.  6.  Disposition: Follow-up lipids and LFTs in approximately 6 weeks.  Follow-up in clinic in 6 months or sooner if necessary.  Murray Hodgkins,  NP 11/03/2019, 5:22 PM

## 2019-11-10 ENCOUNTER — Encounter: Payer: Self-pay | Admitting: Obstetrics and Gynecology

## 2019-11-10 ENCOUNTER — Other Ambulatory Visit: Payer: Self-pay

## 2019-11-10 ENCOUNTER — Ambulatory Visit (INDEPENDENT_AMBULATORY_CARE_PROVIDER_SITE_OTHER): Payer: BC Managed Care – PPO | Admitting: Obstetrics and Gynecology

## 2019-11-10 VITALS — BP 126/74 | Ht 69.0 in | Wt 217.0 lb

## 2019-11-10 DIAGNOSIS — N946 Dysmenorrhea, unspecified: Secondary | ICD-10-CM

## 2019-11-10 DIAGNOSIS — N921 Excessive and frequent menstruation with irregular cycle: Secondary | ICD-10-CM

## 2019-11-10 NOTE — Progress Notes (Signed)
Preoperative History and Physical  Sarah Mills is a 44 y.o. [phone] here for surgical management of menorrhagia with irregular cycle and dysmenorrhea.   No significant preoperative concerns. She has received cardiac clearance from her cardiologist.   History of Present Illness: 44 y.o. G79P3003 female with heavy, irregular menses.  This problem started in February.  Prior to February her periods would come monthly, lasted 4-5 days.  The first couple of days were heavy, then they became lighter.  Since February she has bleeding every day. Some days are lighter with passes blood clots. Other days she has a steady heavy flow.  She passes blood clots every day.  On the heaviest days she is changing a pad every 1/2 hour.  Sometimes she changes pads every hour.    She states that it has been a while since her last pap smear.  Her last one, she states, was normal.   She has never had a mammogram.  She notes no weight changes. She has cramps. She denies bloating.  She does have some early satiety. Denies constipation.   She went to the ER on 6/11 and had normal labs and an essentially normal pelvic ultrasound.  Pap smear: 10/23/2019 normal, no STDs Endometrial biopsy: 10/23/2019 - no hyperplasia or malignancy.   Proposed surgery: Total laparoscopic hysterectomy, bilateral salpingectomy, cystoscopy  Past Medical History:  Diagnosis Date  . Dysmenorrhea   . Headache   . IBS (irritable bowel syndrome)   . Menorrhagia   . NSTEMI (non-ST elevated myocardial infarction) ([place])    a. 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  . Obesity   . Takotsubo cardiomyopathy    a. 12/2015 Cath: Nl cors - ? plaque in small RPDA.  EF 25-35%; b. 12/2015 Echo: EF 40-45%; c. 04/2016 Echo: EF 55-60%, no rwma. Nl RV fxn.  . Tobacco abuse    Past Surgical History:  Procedure Laterality Date  . CARDIAC CATHETERIZATION N/A 01/06/2016   Procedure: Left Heart Cath and Coronary Angiography;   Surgeon: Burnell Blanks, MD;  Location: [facility];  Service: Cardiovascular;  Laterality: N/A;  . TUBAL LIGATION     OB History  Gravida Para Term Preterm AB Living  3 3 3     3   SAB TAB Ectopic Multiple Live Births          3    # Outcome Date GA Lbr Len/2nd Weight Sex Delivery Anes PTL Lv  3 Term           2 Term           1 Term           Patient denies any other pertinent gynecologic issues.   Current Outpatient Medications on File Prior to Visit  Medication Sig Dispense Refill  . acetaminophen (TYLENOL) 500 MG tablet Take 1,000 mg by mouth every 6 (six) hours as needed.    Marland Kitchen atorvastatin (LIPITOR) 40 MG tablet Take 1 tablet (40 mg total) by mouth daily. 90 tablet 3  . carvedilol (COREG) 3.125 MG tablet Take 1 tablet (3.125 mg total) by mouth 2 (two) times daily. 180 tablet 3   No current facility-administered medications on file prior to visit.   No Known Allergies  Social History:   reports that she has been smoking cigarettes. She has a 7.00 pack-year smoking history. She has never used smokeless tobacco. She reports that she does not drink alcohol and does not use drugs.  Family  History  Problem Relation Age of Onset  . CAD Father   . Heart disease Father        Pacemaker and ICD; died in his 39's  . Heart attack Father 30  . CAD Maternal Grandfather     Review of Systems: Noncontributory  PHYSICAL EXAM: Blood pressure 126/74, height 5\' 9"  (1.753 m), weight 217 lb (98.4 kg), last menstrual period 10/20/2019. CONSTITUTIONAL: Well-developed, well-nourished female in no acute distress.  HENT:  Normocephalic, atraumatic, External right and left ear normal. Oropharynx is clear and moist EYES: Conjunctivae and EOM are normal. Pupils are equal, round, and reactive to light. No scleral icterus.  NECK: Normal range of motion, supple, no masses SKIN: Skin is warm and dry. No rash noted. Not diaphoretic. No erythema. No pallor. [place]: Alert and oriented  to person, place, and time. Normal reflexes, muscle tone coordination. No cranial nerve deficit noted. PSYCHIATRIC: Normal mood and affect. Normal behavior. Normal judgment and thought content. CARDIOVASCULAR: Normal heart rate noted, regular rhythm RESPIRATORY: Effort and breath sounds normal, no problems with respiration noted ABDOMEN: Soft, nontender, nondistended. PELVIC: Deferred MUSCULOSKELETAL: Normal range of motion. No edema and no tenderness. 2+ distal pulses.  Imaging Studies: US PELVIC COMPLETE WITH TRANSVAGINAL  Result Date: 10/20/2019 CLINICAL DATA:  Vaginal bleeding EXAM: TRANSABDOMINAL AND TRANSVAGINAL ULTRASOUND OF PELVIS TECHNIQUE: Study was performed transabdominally to optimize pelvic field of view evaluation and transvaginally to optimize internal visceral architecture evaluation. COMPARISON:  None FINDINGS: Uterus Measurements: 7.9 x 5.5 x 5.2 cm = volume: 117.1 mL. No intrauterine mass or inhomogeneity of echotexture noted. Endometrium Thickness: 10 mm.  No focal abnormality visualized. Right ovary Measurements: 2.4 x 1.7 x 2.3 cm = volume: 4.7 mL. Normal appearance/no adnexal mass. Left ovary Measurements: 3.0 x 2.4 x 2.3 cm = volume: 8.5 mL. There is a 2.3 x 2.3 x 2.1 cm cyst in the left ovary, a likely dominant follicle. No other extrauterine pelvic mass. Other findings No abnormal free fluid. IMPRESSION: Probable dominant follicle left ovary measuring 2.3 x 2.3 x 2.1 cm. No other extrauterine pelvic mass. Uterus and endometrium appear normal. Electronically Signed   By: Lowella Grip III M.D.   On: 10/20/2019 14:45   Assessment: 1. Menorrhagia with irregular cycle   2. Dysmenorrhea      Plan: Patient will undergo surgical management with the above-noted surgery.   The risks of surgery were discussed in detail with the patient including but not limited to: bleeding which may require transfusion or reoperation; infection which may require antibiotics; injury to  surrounding organs which may involve bowel, bladder, ureters ; need for additional procedures including laparoscopy or laparotomy; thromboembolic phenomenon, surgical site problems and other postoperative/anesthesia complications. Likelihood of success in alleviating the patient's condition was discussed. Routine postoperative instructions will be reviewed with the patient and her family in detail after surgery.  The patient concurred with the proposed plan, giving informed written consent for the surgery.    Preoperative prophylactic antibiotics, as indicated, and SCDs ordered on call to the OR.    She has been seen by her cardiologist for a history of Takotsubo cardiomyopathy, minimal obstructive CAD.  Restarted on low dose carvedilol 3.125 mg daily and a statin. She should also consider low dose aspirin. She will continue these medications before and after her surgery.    Prentice Docker, MD 11/10/2019 12:11 PM

## 2019-11-10 NOTE — H&P (View-Only) (Signed)
Preoperative History and Physical  Sarah Mills is a 44 y.o. [phone] here for surgical management of menorrhagia with irregular cycle and dysmenorrhea.   No significant preoperative concerns. She has received cardiac clearance from her cardiologist.   History of Present Illness: 44 y.o. G79P3003 female with heavy, irregular menses.  This problem started in February.  Prior to February her periods would come monthly, lasted 4-5 days.  The first couple of days were heavy, then they became lighter.  Since February she has bleeding every day. Some days are lighter with passes blood clots. Other days she has a steady heavy flow.  She passes blood clots every day.  On the heaviest days she is changing a pad every 1/2 hour.  Sometimes she changes pads every hour.    She states that it has been a while since her last pap smear.  Her last one, she states, was normal.   She has never had a mammogram.  She notes no weight changes. She has cramps. She denies bloating.  She does have some early satiety. Denies constipation.   She went to the ER on 6/11 and had normal labs and an essentially normal pelvic ultrasound.  Pap smear: 10/23/2019 normal, no STDs Endometrial biopsy: 10/23/2019 - no hyperplasia or malignancy.   Proposed surgery: Total laparoscopic hysterectomy, bilateral salpingectomy, cystoscopy  Past Medical History:  Diagnosis Date  . Dysmenorrhea   . Headache   . IBS (irritable bowel syndrome)   . Menorrhagia   . NSTEMI (non-ST elevated myocardial infarction) ([place])    a. 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  . Obesity   . Takotsubo cardiomyopathy    a. 12/2015 Cath: Nl cors - ? plaque in small RPDA.  EF 25-35%; b. 12/2015 Echo: EF 40-45%; c. 04/2016 Echo: EF 55-60%, no rwma. Nl RV fxn.  . Tobacco abuse    Past Surgical History:  Procedure Laterality Date  . CARDIAC CATHETERIZATION N/A 01/06/2016   Procedure: Left Heart Cath and Coronary Angiography;   Surgeon: Burnell Blanks, MD;  Location: [facility];  Service: Cardiovascular;  Laterality: N/A;  . TUBAL LIGATION     OB History  Gravida Para Term Preterm AB Living  3 3 3     3   SAB TAB Ectopic Multiple Live Births          3    # Outcome Date GA Lbr Len/2nd Weight Sex Delivery Anes PTL Lv  3 Term           2 Term           1 Term           Patient denies any other pertinent gynecologic issues.   Current Outpatient Medications on File Prior to Visit  Medication Sig Dispense Refill  . acetaminophen (TYLENOL) 500 MG tablet Take 1,000 mg by mouth every 6 (six) hours as needed.    Marland Kitchen atorvastatin (LIPITOR) 40 MG tablet Take 1 tablet (40 mg total) by mouth daily. 90 tablet 3  . carvedilol (COREG) 3.125 MG tablet Take 1 tablet (3.125 mg total) by mouth 2 (two) times daily. 180 tablet 3   No current facility-administered medications on file prior to visit.   No Known Allergies  Social History:   reports that she has been smoking cigarettes. She has a 7.00 pack-year smoking history. She has never used smokeless tobacco. She reports that she does not drink alcohol and does not use drugs.  Family  History  Problem Relation Age of Onset  . CAD Father   . Heart disease Father        Pacemaker and ICD; died in his 36's  . Heart attack Father 49  . CAD Maternal Grandfather     Review of Systems: Noncontributory  PHYSICAL EXAM: Blood pressure 126/74, height 5\' 9"  (1.753 m), weight 217 lb (98.4 kg), last menstrual period 10/20/2019. CONSTITUTIONAL: Well-developed, well-nourished female in no acute distress.  HENT:  Normocephalic, atraumatic, External right and left ear normal. Oropharynx is clear and moist EYES: Conjunctivae and EOM are normal. Pupils are equal, round, and reactive to light. No scleral icterus.  NECK: Normal range of motion, supple, no masses SKIN: Skin is warm and dry. No rash noted. Not diaphoretic. No erythema. No pallor. [place]: Alert and oriented  to person, place, and time. Normal reflexes, muscle tone coordination. No cranial nerve deficit noted. PSYCHIATRIC: Normal mood and affect. Normal behavior. Normal judgment and thought content. CARDIOVASCULAR: Normal heart rate noted, regular rhythm RESPIRATORY: Effort and breath sounds normal, no problems with respiration noted ABDOMEN: Soft, nontender, nondistended. PELVIC: Deferred MUSCULOSKELETAL: Normal range of motion. No edema and no tenderness. 2+ distal pulses.  Imaging Studies: US PELVIC COMPLETE WITH TRANSVAGINAL  Result Date: 10/20/2019 CLINICAL DATA:  Vaginal bleeding EXAM: TRANSABDOMINAL AND TRANSVAGINAL ULTRASOUND OF PELVIS TECHNIQUE: Study was performed transabdominally to optimize pelvic field of view evaluation and transvaginally to optimize internal visceral architecture evaluation. COMPARISON:  None FINDINGS: Uterus Measurements: 7.9 x 5.5 x 5.2 cm = volume: 117.1 mL. No intrauterine mass or inhomogeneity of echotexture noted. Endometrium Thickness: 10 mm.  No focal abnormality visualized. Right ovary Measurements: 2.4 x 1.7 x 2.3 cm = volume: 4.7 mL. Normal appearance/no adnexal mass. Left ovary Measurements: 3.0 x 2.4 x 2.3 cm = volume: 8.5 mL. There is a 2.3 x 2.3 x 2.1 cm cyst in the left ovary, a likely dominant follicle. No other extrauterine pelvic mass. Other findings No abnormal free fluid. IMPRESSION: Probable dominant follicle left ovary measuring 2.3 x 2.3 x 2.1 cm. No other extrauterine pelvic mass. Uterus and endometrium appear normal. Electronically Signed   By: Lowella Grip III M.D.   On: 10/20/2019 14:45   Assessment: 1. Menorrhagia with irregular cycle   2. Dysmenorrhea      Plan: Patient will undergo surgical management with the above-noted surgery.   The risks of surgery were discussed in detail with the patient including but not limited to: bleeding which may require transfusion or reoperation; infection which may require antibiotics; injury to  surrounding organs which may involve bowel, bladder, ureters ; need for additional procedures including laparoscopy or laparotomy; thromboembolic phenomenon, surgical site problems and other postoperative/anesthesia complications. Likelihood of success in alleviating the patient's condition was discussed. Routine postoperative instructions will be reviewed with the patient and her family in detail after surgery.  The patient concurred with the proposed plan, giving informed written consent for the surgery.    Preoperative prophylactic antibiotics, as indicated, and SCDs ordered on call to the OR.    She has been seen by her cardiologist for a history of Takotsubo cardiomyopathy, minimal obstructive CAD.  Restarted on low dose carvedilol 3.125 mg daily and a statin. She should also consider low dose aspirin. She will continue these medications before and after her surgery.    Prentice Docker, MD 11/10/2019 12:11 PM

## 2019-11-14 ENCOUNTER — Other Ambulatory Visit
Admission: RE | Admit: 2019-11-14 | Discharge: 2019-11-14 | Disposition: A | Discharge status: Home / Self Care | Payer: BC Managed Care – PPO | Source: Ambulatory Visit | Attending: Obstetrics and Gynecology | Admitting: Obstetrics and Gynecology

## 2019-11-14 ENCOUNTER — Ambulatory Visit: Payer: BC Managed Care – PPO | Admitting: Family

## 2019-11-14 ENCOUNTER — Encounter
Admission: RE | Admit: 2019-11-14 | Discharge: 2019-11-14 | Disposition: A | Discharge status: Home / Self Care | Payer: BC Managed Care – PPO | Source: Ambulatory Visit | Attending: Obstetrics and Gynecology | Admitting: Obstetrics and Gynecology

## 2019-11-14 ENCOUNTER — Other Ambulatory Visit: Payer: Self-pay

## 2019-11-14 DIAGNOSIS — Z01812 Encounter for preprocedural laboratory examination: Secondary | ICD-10-CM | POA: Diagnosis not present

## 2019-11-14 DIAGNOSIS — Z20822 Contact with and (suspected) exposure to covid-19: Secondary | ICD-10-CM | POA: Diagnosis not present

## 2019-11-14 LAB — COMPREHENSIVE METABOLIC PANEL
ALT: 8 U/L (ref 0–44)
AST: 10 U/L — ABNORMAL LOW (ref 15–41)
Albumin: 3.9 g/dL (ref 3.5–5.0)
Alkaline Phosphatase: 72 U/L (ref 38–126)
Anion gap: 8 (ref 5–15)
BUN: 15 mg/dL (ref 6–20)
CO2: 25 mmol/L (ref 22–32)
Calcium: 8.4 mg/dL — ABNORMAL LOW (ref 8.9–10.3)
Chloride: 104 mmol/L (ref 98–111)
Creatinine, Ser: 0.85 mg/dL (ref 0.44–1.00)
GFR calc Af Amer: >60 mL/min (ref >60)
GFR calc non Af Amer: >60 mL/min (ref >60)
Glucose, Bld: 94 mg/dL (ref 70–99)
Potassium: 4.1 mmol/L (ref 3.5–5.1)
Sodium: 137 mmol/L (ref 135–145)
Total Bilirubin: 0.6 mg/dL (ref 0.3–1.2)
Total Protein: 7.6 g/dL (ref 6.5–8.1)

## 2019-11-14 LAB — TYPE AND SCREEN
ABO/RH(D): A POS
Antibody Screen: NEGATIVE

## 2019-11-14 LAB — SARS CORONAVIRUS 2 (TAT 6-24 HRS): SARS Coronavirus 2: NEGATIVE

## 2019-11-14 NOTE — Patient Instructions (Signed)
Your procedure is scheduled ZC:HYIFO 7/8 Report to Day Surgery. To find out your arrival time please call [phone] between 1PM - 3PM on Wed. 7/7.  Remember: Instructions that are not followed completely may result in serious medical risk,  up to and including death, or upon the discretion of your surgeon and anesthesiologist your  surgery may need to be rescheduled.     _X__ 1. Do not eat food after midnight the night before your procedure.                 No gum chewing or hard candies. You may drink clear liquids up to 2 hours                 before you are scheduled to arrive for your surgery- DO not drink clear                 liquids within 2 hours of the start of your surgery.                 Clear Liquids include:  water, apple juice without pulp, clear Gatorade, G2 or                  Gatorade Zero (avoid Red/Purple/Blue), Black Coffee or Tea (Do not add                 anything to coffee or tea). __x___2.   Complete the carbohydrate drink provided to you, 2 hours before arrival.  __X__2.  On the morning of surgery brush your teeth with toothpaste and water, you                may rinse your mouth with mouthwash if you wish.  Do not swallow any toothpaste of mouthwash.     ___ 3.  No Alcohol for 24 hours before or after surgery.   _X__ 4.  Do Not Smoke or use e-cigarettes For 24 Hours Prior to Your Surgery.                 Do not use any chewable tobacco products for at least 6 hours prior to                 Surgery.  __  5.  Do not use any recreational drugs (marijuana, cocaine, heroin, ecstacy, MDMA or other)                For at least one week prior to your surgery.  Combination of these drugs with anesthesia                May have life threatening results.  ____  6.  Bring all medications with you on the day of surgery if instructed.   __x__  7.  Notify your doctor if there is any change in your medical condition      (cold, fever,  infections).     Do not wear jewelry, make-up, hairpins, clips or nail polish. Do not wear lotions, powders, or perfumes. You may wear deodorant. Do not shave 48 hours prior to surgery.  Do not bring valuables to the hospital.    [facility] is not responsible for any belongings or valuables.  Contacts, dentures or bridgework may not be worn into surgery. Leave your suitcase in the car. After surgery it may be brought to your room. For patients admitted to the hospital, discharge time is determined by your treatment team.   Patients discharged the  day of surgery will not be allowed to drive home.   Make arrangements for someone to be with you for the first 24 hours of your Same Day Discharge.    Please read over the following fact sheets that you were given:   __x__ Take these medicines the morning of surgery with A SIP OF WATER:    1. atorvastatin (LIPITOR) 40 MG tablet  2. carvedilol (COREG) 3.125 MG tablet  3.   4.  5.  6.  ____ Fleet Enema (as directed)   __x__ Use CHG Soap (or wipes) as directed  ____ Use Benzoyl Peroxide Gel as instructed  ____ Use inhalers on the day of surgery  ____ Stop metformin 2 days prior to surgery    ____ Take 1/2 of usual insulin dose the night before surgery. No insulin the morning          of surgery.   ____ Stop Coumadin/Plavix/aspirin on   __x__ Stop Anti-inflammatories no ibuprofen aleve or aspirin   ____ Stop supplements until after surgery.    ____ Bring C-Pap to the hospital.

## 2019-11-15 ENCOUNTER — Other Ambulatory Visit: Payer: BC Managed Care – PPO

## 2019-11-16 ENCOUNTER — Ambulatory Visit
Admission: RE | Admit: 2019-11-16 | Discharge: 2019-11-16 | Disposition: A | Discharge status: Home / Self Care | Payer: BC Managed Care – PPO | Attending: Obstetrics and Gynecology | Admitting: Obstetrics and Gynecology

## 2019-11-16 ENCOUNTER — Ambulatory Visit: Payer: BC Managed Care – PPO | Admitting: Anesthesiology

## 2019-11-16 ENCOUNTER — Encounter
Admission: RE | Disposition: A | Discharge status: Home / Self Care | Payer: Self-pay | Source: Home / Self Care | Attending: Obstetrics and Gynecology

## 2019-11-16 ENCOUNTER — Encounter: Payer: Self-pay | Admitting: Obstetrics and Gynecology

## 2019-11-16 ENCOUNTER — Other Ambulatory Visit: Payer: Self-pay

## 2019-11-16 DIAGNOSIS — I252 Old myocardial infarction: Secondary | ICD-10-CM | POA: Diagnosis not present

## 2019-11-16 DIAGNOSIS — E669 Obesity, unspecified: Secondary | ICD-10-CM | POA: Diagnosis not present

## 2019-11-16 DIAGNOSIS — F17210 Nicotine dependence, cigarettes, uncomplicated: Secondary | ICD-10-CM | POA: Diagnosis not present

## 2019-11-16 DIAGNOSIS — N920 Excessive and frequent menstruation with regular cycle: Secondary | ICD-10-CM | POA: Insufficient documentation

## 2019-11-16 DIAGNOSIS — N841 Polyp of cervix uteri: Secondary | ICD-10-CM | POA: Insufficient documentation

## 2019-11-16 DIAGNOSIS — N921 Excessive and frequent menstruation with irregular cycle: Secondary | ICD-10-CM | POA: Diagnosis not present

## 2019-11-16 DIAGNOSIS — D251 Intramural leiomyoma of uterus: Secondary | ICD-10-CM | POA: Insufficient documentation

## 2019-11-16 DIAGNOSIS — Z79899 Other long term (current) drug therapy: Secondary | ICD-10-CM | POA: Insufficient documentation

## 2019-11-16 DIAGNOSIS — I2510 Atherosclerotic heart disease of native coronary artery without angina pectoris: Secondary | ICD-10-CM | POA: Diagnosis not present

## 2019-11-16 DIAGNOSIS — N946 Dysmenorrhea, unspecified: Secondary | ICD-10-CM

## 2019-11-16 DIAGNOSIS — Z8249 Family history of ischemic heart disease and other diseases of the circulatory system: Secondary | ICD-10-CM | POA: Diagnosis not present

## 2019-11-16 DIAGNOSIS — N838 Other noninflammatory disorders of ovary, fallopian tube and broad ligament: Secondary | ICD-10-CM | POA: Insufficient documentation

## 2019-11-16 DIAGNOSIS — D252 Subserosal leiomyoma of uterus: Secondary | ICD-10-CM | POA: Diagnosis not present

## 2019-11-16 HISTORY — PX: TOTAL LAPAROSCOPIC HYSTERECTOMY WITH SALPINGECTOMY: SHX6742

## 2019-11-16 HISTORY — PX: CYSTOSCOPY: SHX5120

## 2019-11-16 LAB — POCT PREGNANCY, URINE: Preg Test, Ur: NEGATIVE

## 2019-11-16 SURGERY — HYSTERECTOMY, TOTAL, LAPAROSCOPIC, WITH SALPINGECTOMY
Anesthesia: General

## 2019-11-16 MED ORDER — FENTANYL CITRATE (PF) 100 MCG/2ML IJ SOLN
INTRAMUSCULAR | Status: DC | PRN
  Administered 2019-11-16 (×2): 50 ug via INTRAVENOUS

## 2019-11-16 MED ORDER — LIDOCAINE HCL (CARDIAC) PF 100 MG/5ML IV SOSY
PREFILLED_SYRINGE | INTRAVENOUS | Status: DC | PRN
  Administered 2019-11-16: 100 mg via INTRAVENOUS

## 2019-11-16 MED ORDER — ROCURONIUM BROMIDE 100 MG/10ML IV SOLN
INTRAVENOUS | Status: DC | PRN
  Administered 2019-11-16 (×2): 10 mg via INTRAVENOUS
  Administered 2019-11-16: 50 mg via INTRAVENOUS

## 2019-11-16 MED ORDER — CEFAZOLIN SODIUM-DEXTROSE 2-4 GM/100ML-% IV SOLN
INTRAVENOUS | Status: AC
  Filled 2019-11-16: qty 100

## 2019-11-16 MED ORDER — ORAL CARE MOUTH RINSE: 15.0000 mL | Freq: Once | OROMUCOSAL | Status: AC

## 2019-11-16 MED ORDER — IBUPROFEN 600 MG PO TABS: 600.0000 mg | ORAL_TABLET | Freq: Four times a day (QID) | ORAL | 0 refills | Status: DC

## 2019-11-16 MED ORDER — FAMOTIDINE 20 MG PO TABS: 20.0000 mg | ORAL_TABLET | Freq: Once | ORAL | Status: AC

## 2019-11-16 MED ORDER — DEXAMETHASONE SODIUM PHOSPHATE 10 MG/ML IJ SOLN
INTRAMUSCULAR | Status: DC | PRN
  Administered 2019-11-16: 10 mg via INTRAVENOUS

## 2019-11-16 MED ORDER — OXYCODONE HCL 5 MG/5ML PO SOLN: 5.0000 mg | Freq: Once | ORAL | Status: AC | PRN

## 2019-11-16 MED ORDER — BUPIVACAINE HCL (PF) 0.5 % IJ SOLN
INTRAMUSCULAR | Status: AC
  Filled 2019-11-16: qty 30

## 2019-11-16 MED ORDER — OXYCODONE HCL 5 MG PO TABS
ORAL_TABLET | ORAL | Status: AC
  Administered 2019-11-16: 5 mg via ORAL
  Filled 2019-11-16: qty 1

## 2019-11-16 MED ORDER — FENTANYL CITRATE (PF) 100 MCG/2ML IJ SOLN
INTRAMUSCULAR | Status: AC
  Administered 2019-11-16: 25 ug via INTRAVENOUS
  Filled 2019-11-16: qty 2

## 2019-11-16 MED ORDER — LIDOCAINE HCL (PF) 2 % IJ SOLN
INTRAMUSCULAR | Status: AC
  Filled 2019-11-16: qty 5

## 2019-11-16 MED ORDER — PROPOFOL 10 MG/ML IV BOLUS
INTRAVENOUS | Status: AC
  Filled 2019-11-16: qty 20

## 2019-11-16 MED ORDER — KETOROLAC TROMETHAMINE 30 MG/ML IJ SOLN
INTRAMUSCULAR | Status: DC | PRN
  Administered 2019-11-16: 30 mg via INTRAVENOUS

## 2019-11-16 MED ORDER — CHLORHEXIDINE GLUCONATE 0.12 % MT SOLN
OROMUCOSAL | Status: AC
  Administered 2019-11-16: 15 mL via OROMUCOSAL
  Filled 2019-11-16: qty 15

## 2019-11-16 MED ORDER — FAMOTIDINE 20 MG PO TABS
ORAL_TABLET | ORAL | Status: AC
  Administered 2019-11-16: 20 mg via ORAL
  Filled 2019-11-16: qty 1

## 2019-11-16 MED ORDER — LIDOCAINE HCL (PF) 2 % IJ SOLN
INTRAMUSCULAR | Status: DC | PRN
  Administered 2019-11-16: 1.5 mg/kg/h via INTRADERMAL

## 2019-11-16 MED ORDER — CHLORHEXIDINE GLUCONATE 0.12 % MT SOLN: 15.0000 mL | Freq: Once | OROMUCOSAL | Status: AC

## 2019-11-16 MED ORDER — OXYCODONE-ACETAMINOPHEN 5-325 MG PO TABS: 1.0000 | ORAL_TABLET | Freq: Four times a day (QID) | ORAL | 0 refills | Status: DC | PRN

## 2019-11-16 MED ORDER — MIDAZOLAM HCL 2 MG/2ML IJ SOLN
INTRAMUSCULAR | Status: AC
  Filled 2019-11-16: qty 2

## 2019-11-16 MED ORDER — ACETAMINOPHEN 10 MG/ML IV SOLN
INTRAVENOUS | Status: AC
  Filled 2019-11-16: qty 100

## 2019-11-16 MED ORDER — LACTATED RINGERS IV SOLN: INTRAVENOUS | Status: DC

## 2019-11-16 MED ORDER — KETOROLAC TROMETHAMINE 30 MG/ML IJ SOLN
INTRAMUSCULAR | Status: AC
  Filled 2019-11-16: qty 1

## 2019-11-16 MED ORDER — ONDANSETRON HCL 4 MG/2ML IJ SOLN
INTRAMUSCULAR | Status: DC | PRN
  Administered 2019-11-16: 4 mg via INTRAVENOUS

## 2019-11-16 MED ORDER — POVIDONE-IODINE 10 % EX SWAB
2.0000 "application " | Freq: Once | CUTANEOUS | Status: AC
  Administered 2019-11-16: 2 via TOPICAL

## 2019-11-16 MED ORDER — MIDAZOLAM HCL 2 MG/2ML IJ SOLN
INTRAMUSCULAR | Status: DC | PRN
  Administered 2019-11-16: 2 mg via INTRAVENOUS

## 2019-11-16 MED ORDER — ACETAMINOPHEN 10 MG/ML IV SOLN
INTRAVENOUS | Status: DC | PRN
  Administered 2019-11-16: 1000 mg via INTRAVENOUS

## 2019-11-16 MED ORDER — FENTANYL CITRATE (PF) 100 MCG/2ML IJ SOLN
25.0000 ug | INTRAMUSCULAR | Status: DC | PRN
  Administered 2019-11-16 (×3): 25 ug via INTRAVENOUS

## 2019-11-16 MED ORDER — EPHEDRINE SULFATE 50 MG/ML IJ SOLN
INTRAMUSCULAR | Status: DC | PRN
  Administered 2019-11-16: 5 mg via INTRAVENOUS
  Administered 2019-11-16: 10 mg via INTRAVENOUS

## 2019-11-16 MED ORDER — ONDANSETRON 4 MG PO TBDP: 4.0000 mg | ORAL_TABLET | Freq: Four times a day (QID) | ORAL | 0 refills | Status: DC | PRN

## 2019-11-16 MED ORDER — OXYCODONE HCL 5 MG PO TABS: 5.0000 mg | ORAL_TABLET | Freq: Once | ORAL | Status: AC | PRN

## 2019-11-16 MED ORDER — BUPIVACAINE HCL 0.5 % IJ SOLN
INTRAMUSCULAR | Status: DC | PRN
  Administered 2019-11-16: 15 mL

## 2019-11-16 MED ORDER — PROPOFOL 10 MG/ML IV BOLUS
INTRAVENOUS | Status: DC | PRN
  Administered 2019-11-16: 150 mg via INTRAVENOUS

## 2019-11-16 MED ORDER — CEFAZOLIN SODIUM-DEXTROSE 2-4 GM/100ML-% IV SOLN
2.0000 g | INTRAVENOUS | Status: AC
  Administered 2019-11-16: 2 g via INTRAVENOUS

## 2019-11-16 MED ORDER — SUGAMMADEX SODIUM 200 MG/2ML IV SOLN
INTRAVENOUS | Status: DC | PRN
  Administered 2019-11-16: 200 mg via INTRAVENOUS

## 2019-11-16 MED ORDER — ASPIRIN EC 81 MG PO TBEC: 81.0000 mg | DELAYED_RELEASE_TABLET | Freq: Every day | ORAL | Status: AC

## 2019-11-16 MED ORDER — FENTANYL CITRATE (PF) 100 MCG/2ML IJ SOLN
INTRAMUSCULAR | Status: AC
  Filled 2019-11-16: qty 2

## 2019-11-16 MED ORDER — HEMOSTATIC AGENTS (NO CHARGE) OPTIME
TOPICAL | Status: DC | PRN
  Administered 2019-11-16: 1 via TOPICAL

## 2019-11-16 SURGICAL SUPPLY — 57 items
APPLICATOR ARISTA FLEXITIP XL (MISCELLANEOUS) ×3 IMPLANT
BAG URINE DRAIN 2000ML AR STRL (UROLOGICAL SUPPLIES) ×3 IMPLANT
BLADE SURG SZ11 CARB STEEL (BLADE) ×3 IMPLANT
CATH FOLEY 2WAY  5CC 16FR (CATHETERS) ×1
CATH URTH 16FR FL 2W BLN LF (CATHETERS) ×2 IMPLANT
CHLORAPREP W/TINT 26 (MISCELLANEOUS) ×3 IMPLANT
COVER WAND RF STERILE (DRAPES) ×3 IMPLANT
DEFOGGER SCOPE WARMER CLEARIFY (MISCELLANEOUS) ×3 IMPLANT
DERMABOND ADVANCED (GAUZE/BANDAGES/DRESSINGS) ×1
DERMABOND ADVANCED .7 DNX12 (GAUZE/BANDAGES/DRESSINGS) ×2 IMPLANT
DEVICE SUTURE ENDOST 10MM (ENDOMECHANICALS) ×3 IMPLANT
DRAPE 3/4 80X56 (DRAPES) ×3 IMPLANT
DRAPE LEGGINS SURG 28X43 STRL (DRAPES) ×3 IMPLANT
DRAPE UNDER BUTTOCK W/FLU (DRAPES) ×3 IMPLANT
GAUZE 4X4 16PLY RFD (DISPOSABLE) ×3 IMPLANT
GLOVE BIO SURGEON STRL SZ7 (GLOVE) ×18 IMPLANT
GLOVE BIOGEL PI IND STRL 7.5 (GLOVE) ×2 IMPLANT
GLOVE BIOGEL PI INDICATOR 7.5 (GLOVE) ×1
GLOVE INDICATOR 7.5 STRL GRN (GLOVE) ×6 IMPLANT
GOWN STRL REUS W/ TWL LRG LVL3 (GOWN DISPOSABLE) ×12 IMPLANT
GOWN STRL REUS W/ TWL XL LVL3 (GOWN DISPOSABLE) ×2 IMPLANT
GOWN STRL REUS W/TWL LRG LVL3 (GOWN DISPOSABLE) ×6
GOWN STRL REUS W/TWL XL LVL3 (GOWN DISPOSABLE) ×1
GRASPER SUT TROCAR 14GX15 (MISCELLANEOUS) ×3 IMPLANT
HEMOSTAT ARISTA ABSORB 1G (HEMOSTASIS) ×3 IMPLANT
IRRIGATION STRYKERFLOW (MISCELLANEOUS) IMPLANT
IRRIGATOR STRYKERFLOW (MISCELLANEOUS)
IV LACTATED RINGERS 1000ML (IV SOLUTION) ×6 IMPLANT
KIT PINK PAD W/HEAD ARE REST (MISCELLANEOUS) ×3
KIT PINK PAD W/HEAD ARM REST (MISCELLANEOUS) ×2 IMPLANT
KIT TURNOVER CYSTO (KITS) ×3 IMPLANT
LABEL OR SOLS (LABEL) ×3 IMPLANT
LIGASURE VESSEL 5MM BLUNT TIP (ELECTROSURGICAL) ×3 IMPLANT
MANIPULATOR VCARE LG CRV RETR (MISCELLANEOUS) ×3 IMPLANT
MANIPULATOR VCARE SML CRV RETR (MISCELLANEOUS) IMPLANT
MANIPULATOR VCARE STD CRV RETR (MISCELLANEOUS) IMPLANT
NEEDLE HYPO 22GX1.5 SAFETY (NEEDLE) ×3 IMPLANT
OCCLUDER COLPOPNEUMO (BALLOONS) ×3 IMPLANT
PACK LAP CHOLECYSTECTOMY (MISCELLANEOUS) ×3 IMPLANT
PAD OB MATERNITY 4.3X12.25 (PERSONAL CARE ITEMS) ×3 IMPLANT
PAD PREP 24X41 OB/GYN DISP (PERSONAL CARE ITEMS) ×3 IMPLANT
SCISSORS METZENBAUM CVD 33 (INSTRUMENTS) ×3 IMPLANT
SET CYSTO W/LG BORE CLAMP LF (SET/KITS/TRAYS/PACK) ×3 IMPLANT
SET TRI-LUMEN FLTR TB AIRSEAL (TUBING) ×3 IMPLANT
SLEEVE ENDOPATH XCEL 5M (ENDOMECHANICALS) ×3 IMPLANT
SOL PREP PVP 2OZ (MISCELLANEOUS) ×3
SOLUTION PREP PVP 2OZ (MISCELLANEOUS) ×2 IMPLANT
SURGILUBE 2OZ TUBE FLIPTOP (MISCELLANEOUS) IMPLANT
SUT ENDO VLOC 180-0-8IN (SUTURE) ×3 IMPLANT
SUT MNCRL 4-0 (SUTURE) ×1
SUT MNCRL 4-0 27XMFL (SUTURE) ×2
SUT VIC AB 0 CT1 36 (SUTURE) ×3 IMPLANT
SUTURE MNCRL 4-0 27XMF (SUTURE) ×2 IMPLANT
SYR 10ML LL (SYRINGE) ×6 IMPLANT
SYR 50ML LL SCALE MARK (SYRINGE) ×3 IMPLANT
TROCAR PORT AIRSEAL 8X100 (TROCAR) ×3 IMPLANT
TROCAR XCEL NON-BLD 5MMX100MML (ENDOMECHANICALS) ×3 IMPLANT

## 2019-11-16 NOTE — Op Note (Signed)
Operative Note    Pre-Operative Diagnosis:  1) Menorrhagia with irregular cycle [N92.1] 2) Dysmenorrhea [N95.6]  Post-Operative Diagnosis:  1) Menorrhagia with irregular cycle [N92.1] 2) Dysmenorrhea [N95.6]  Procedures:  1. Total Laparoscopic Hysterectomy, bilateral salpingectomy 2. Cystoscopy  Primary Surgeon: Prentice Docker, MD   Assistant Surgeon: Barnett Applebaum, MD - No other capable assistant available, in surgery requiring high level assistant.  EBL: 75 mL   IVF: 500 mL   Urine output: 300 mL  Specimens:  1) uterus with cervix 2) bilateral fallopian tubes  Drains: none  Complications: None   Disposition: PACU   Condition: Stable   Findings:  1) Globally enlarged uterus with normal appearing ovaries 2) Fallopian tubes with changes consistent with tubal ligation using Filshie clips. 3) No apparent injury to bladder with noted efflux of urine from the bilateral ureteral orifices on cystoscopy  Procedure Summary:  The patient was taken to the operating room where general anesthesia was administered and found to be adequate. She was placed in the dorsal supine lithotomy position in [place] stirrups and prepped and draped in usual sterile fashion. After a timeout was called, an indwelling catheter was placed in her bladder. A sterile speculum was placed in the vagina and a single-tooth tenaculum was used to grasp the anterior lip of the cervix. An V-Care uterine manipulator was affixed to the uterus in accordance to the manufacturers recommendations. The speculum and tenaculum was removed from the vagina.  Attention was turned to the abdomen where, after injection of local anesthetic, a 5 mm infraumbilical incision was made with the scalpel. Entry into the abdomen was obtained via Optiview trocar technique (a blunt entry technique with camera visualization through the obturator upon entry). Verification of entry into the abdomen was obtained using opening pressures. The  abdomen was insufflated with CO2. The camera was introduced through the trocar with verification of atraumatic entry. Left and right lower quadrant 5 mm ports were created via direct intra-abdominal camera visualization without difficulty. An 8 mm suprapubic Air Seal port was placed in a similar fashion without difficulty.  After inspection of the abdomen and pelvis with the above-noted findings, the bilateral ureters were identified and found to be well away from the operative area of interest. The left fallopian tube was grasped at the fimbriated end and was transected using the LigaSure along the mesosalpinx in a lateral to medial fashion. The LigaSure then was used to transect the left round ligament and the utero-ovarian ligament was transected. Tissue was divided along the left broad ligament to the level of the interior cervical os. Bladder tissue was dissected off the lower uterine segment and cervix without difficulty. The left uterine artery was skeletonized and identified and after ligation was transected with the LigaSure device. The same procedure was carried out on the right side. The colpotomy was performed using monopolar electrocautery in a circumferential fashion following the KOH ring.  The vaginal occluder balloon was insufflated prior to colpotomy to prevent loss of pneumoperitoneum. The uterus and fallopian tubes and cervix were removed through the vagina.  There was a Filshie clip noted near the cervix along the uterosacral ligament. This was removed without difficulty through an operative port.  Closure of the vaginal cuff was undertaken using the V-lock stitch in a running fashion. All vascular pedicles were inspected and found to be hemostatic. Copious irrigation was undertaken and hemostasis was again verified. One gram of Arista was placed along the vaginal cuff closure to assure ongoing hemostasis.  The  suprapubic trocar was removed and the fascia was reapproximated using #0 Vicryl  with a single stitch.   Cystoscopy was undertaken at this point. The Foley catheter was removed and the 70 cystoscope was gently introduced through the urethra. The bladder survey was undertaken with efflux of urine from both orifices noted. There were no defects noted in the bladder wall. The cystoscope was removed and the Foley catheter was replaced.    Attention was returned to the abdomen.  The abdomen was then desufflated of CO2 after removal of all instruments. The suprapubic skin incision was closed using 4-0 Vicryl in a subcuticular fashion. The remaining skin incisions were closed using surgical skin glue and a layer of surgical skin glue was placed over all incisions. The catheter was then removed from the bladder. The vagina was inspected and found to be free of instrumentation and sponges.  The assistant surgeon created the right lower quadrant port site, performed dissection along the right adnexal structures, retraction of tissue, and assistance with visualization.   The patient tolerated the procedure well.  Sponge, lap, needle, and instrument counts were correct x 2.  VTE prophylaxis: SCDs. Antibiotic prophylaxis: Ancef 2 grams IV prior to skin incision. She was awakened in the operating room and was taken to the PACU in stable condition. The assistant surgeon was an MD due to lack of availability of another Counselling psychologist.   Prentice Docker, MD 11/16/2019 9:44 AM

## 2019-11-16 NOTE — Interval H&P Note (Signed)
History and Physical Interval Note:  11/16/2019 7:24 AM  Sarah Mills  has presented today for surgery, with the diagnosis of Menorrhagia w irregular cycle N92.1 Dysmenorrhea N94.6.  The various methods of treatment have been discussed with the patient and family. After consideration of risks, benefits and other options for treatment, the patient has consented to  Procedure(s): TOTAL LAPAROSCOPIC HYSTERECTOMY WITH BILATERIAL SALPINGECTOMY (Bilateral) CYSTOSCOPY (N/A) as a surgical intervention.  The patient's history has been reviewed, patient examined, no change in status, stable for surgery.  I have reviewed the patient's chart and labs.  Questions were answered to the patient's satisfaction.    Prentice Docker, MD, [facility], [facility] 11/16/2019 7:24 AM

## 2019-11-16 NOTE — Anesthesia Procedure Notes (Signed)
Procedure Name: Intubation Date/Time: 11/16/2019 7:38 AM Performed by: Debe Coder, CRNA Pre-anesthesia Checklist: Patient identified, Emergency Drugs available, Suction available and Patient being monitored Patient Re-evaluated:Patient Re-evaluated prior to induction Oxygen Delivery Method: Circle system utilized Preoxygenation: Pre-oxygenation with 100% oxygen Induction Type: IV induction Ventilation: Mask ventilation without difficulty Grade View: Grade I Tube type: Oral Tube size: 7.5 mm Number of attempts: 1 Airway Equipment and Method: Stylet,  Oral airway and Video-laryngoscopy Placement Confirmation: ETT inserted through vocal cords under direct vision,  positive ETCO2 and breath sounds checked- equal and bilateral Secured at: 21 cm Tube secured with: Tape Dental Injury: Teeth and Oropharynx as per pre-operative assessment

## 2019-11-16 NOTE — Discharge Instructions (Signed)

## 2019-11-16 NOTE — Anesthesia Preprocedure Evaluation (Signed)
Anesthesia Evaluation  Patient identified by MRN, date of birth, ID band Patient awake    Reviewed: Allergy & Precautions, H&P , NPO status , Patient's Chart, lab work & pertinent test results  History of Anesthesia Complications Negative for: history of anesthetic complications  Airway Mallampati: III  TM Distance: <3 FB Neck ROM: full    Dental  (+) Chipped, Poor Dentition   Pulmonary neg shortness of breath, Current Smoker and Patient abstained from smoking.,    Pulmonary exam normal        Cardiovascular Exercise Tolerance: Good (-) angina+ Past MI and +CHF  (-) DOE Normal cardiovascular exam     Neuro/Psych  Headaches, negative psych ROS   GI/Hepatic negative GI ROS, Neg liver ROS, neg GERD  ,  Endo/Other  negative endocrine ROS  Renal/GU      Musculoskeletal   Abdominal   Peds  Hematology negative hematology ROS (+)   Anesthesia Other Findings Patient has cardiac clearance for this procedure.   Past Medical History: No date: Dysmenorrhea No date: Headache No date: IBS (irritable bowel syndrome) No date: Menorrhagia No date: NSTEMI (non-ST elevated myocardial infarction) ([place])     Comment:  a. 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 No date: Obesity No date: Takotsubo cardiomyopathy     Comment:  a. 12/2015 Cath: Nl cors - ? plaque in small RPDA.  EF               25-35%; b. 12/2015 Echo: EF 40-45%; c. 04/2016 Echo: EF               55-60%, no rwma. Nl RV fxn. No date: Tobacco abuse  Past Surgical History: 01/06/2016: CARDIAC CATHETERIZATION; N/A     Comment:  Procedure: Left Heart Cath and Coronary Angiography;                Surgeon: Burnell Blanks, MD;  Location: [place] [facility];  Service: Cardiovascular;  Laterality:               N/A; No date: TUBAL LIGATION     Reproductive/Obstetrics negative OB ROS                              Anesthesia Physical Anesthesia Plan  ASA: III  Anesthesia Plan: General ETT   Post-op Pain Management:    Induction: Intravenous  PONV Risk Score and Plan: Ondansetron, Dexamethasone, Midazolam and Treatment may vary due to age or medical condition  Airway Management Planned: Oral ETT  Additional Equipment:   Intra-op Plan:   Post-operative Plan: Extubation in OR  Informed Consent: I have reviewed the patients History and Physical, chart, labs and discussed the procedure including the risks, benefits and alternatives for the proposed anesthesia with the patient or authorized representative who has indicated his/her understanding and acceptance.     Dental Advisory Given  Plan Discussed with: Anesthesiologist, CRNA and Surgeon  Anesthesia Plan Comments: (Patient consented for risks of anesthesia including but not limited to:  - adverse reactions to medications - damage to eyes, teeth, lips or other oral mucosa - nerve damage due to positioning  - sore throat or hoarseness - Damage to heart, brain, nerves, lungs, other parts of body or loss of life  Patient voiced  understanding.)        Anesthesia Quick Evaluation

## 2019-11-16 NOTE — Anesthesia Postprocedure Evaluation (Signed)
Anesthesia Post Note  Patient: Sarah Mills  Procedure(s) Performed: TOTAL LAPAROSCOPIC HYSTERECTOMY WITH BILATERIAL SALPINGECTOMY (Bilateral ) CYSTOSCOPY (N/A )  Patient location during evaluation: PACU Anesthesia Type: General Level of consciousness: awake and alert Pain management: pain level controlled Vital Signs Assessment: post-procedure vital signs reviewed and stable Respiratory status: spontaneous breathing, nonlabored ventilation and respiratory function stable Cardiovascular status: blood pressure returned to baseline and stable Postop Assessment: no apparent nausea or vomiting Anesthetic complications: no   No complications documented.   Last Vitals:  Vitals:   11/16/19 1105 11/16/19 1155  BP: 126/73 130/77  Pulse: 69 62  Resp: 16 18  Temp: 36.6 C 36.6 C  SpO2: 97% 99%    Last Pain:  Vitals:   11/16/19 1155  TempSrc: Temporal  PainSc: 3                  Bibiana Gillean Harvie Heck

## 2019-11-16 NOTE — Transfer of Care (Signed)
Immediate Anesthesia Transfer of Care Note  Patient: Sarah Mills  Procedure(s) Performed: TOTAL LAPAROSCOPIC HYSTERECTOMY WITH BILATERIAL SALPINGECTOMY (Bilateral ) CYSTOSCOPY (N/A )  Patient Location: PACU  Anesthesia Type:General  Level of Consciousness: sedated  Airway & Oxygen Therapy: Patient Spontanous Breathing  Post-op Assessment: Report given to RN and Post -op Vital signs reviewed and stable  Post vital signs: Reviewed and stable  Last Vitals:  Vitals Value Taken Time  BP 141/67 11/16/19 0953  Temp 36.1 C 11/16/19 0953  Pulse 65 11/16/19 0958  Resp 25 11/16/19 0958  SpO2 97 % 11/16/19 0958    Last Pain:  Vitals:   11/16/19 0617  TempSrc: Temporal  PainSc: 0-No pain         Complications: No complications documented.

## 2019-11-16 NOTE — OR Nursing (Signed)
Patient voided, denies pain, tolerating po fluids and crackers.  Patient desires discharge at this time.  Discharged via wheelchair with mother.

## 2019-11-17 ENCOUNTER — Encounter: Payer: Self-pay | Admitting: Obstetrics and Gynecology

## 2019-11-21 LAB — SURGICAL PATHOLOGY

## 2019-11-24 ENCOUNTER — Ambulatory Visit (INDEPENDENT_AMBULATORY_CARE_PROVIDER_SITE_OTHER): Payer: BC Managed Care – PPO | Admitting: Obstetrics and Gynecology

## 2019-11-24 ENCOUNTER — Encounter: Payer: Self-pay | Admitting: Obstetrics and Gynecology

## 2019-11-24 ENCOUNTER — Other Ambulatory Visit: Payer: Self-pay

## 2019-11-24 VITALS — BP 118/74 | Ht 69.0 in | Wt 216.0 lb

## 2019-11-24 DIAGNOSIS — Z09 Encounter for follow-up examination after completed treatment for conditions other than malignant neoplasm: Secondary | ICD-10-CM

## 2019-11-24 DIAGNOSIS — N921 Excessive and frequent menstruation with irregular cycle: Secondary | ICD-10-CM

## 2019-11-24 DIAGNOSIS — N946 Dysmenorrhea, unspecified: Secondary | ICD-10-CM

## 2019-11-24 NOTE — Progress Notes (Signed)
   Postoperative Follow-up Patient presents post op from TLH/BS/Cysto 1 week ago for menorrhagia with irregular cycle and dysmenorrhea.  Subjective: Patient reports marked improvement in her preop symptoms. Eating a regular diet without difficulty. The patient is not having any pain.  Activity: increasing slowly.  Objective: Vitals:   11/24/19 0810  BP: 118/74   Vital Signs: BP 118/74   Ht 5\' 9"  (1.753 m)   Wt 216 lb (98 kg)   BMI 31.90 kg/m  Constitutional: Well nourished, well developed female in no acute distress.  HEENT: normal Skin: Warm and dry.  Extremity: no edema  Abdomen: s/nt/nd/+BS clean, dry, intact and no erythema, induration, warmth, and tenderness at all incision sites  Assessment: 44 y.o. s/p above-noted surgery progressing well  Plan: Patient has done well after surgery with no apparent complications.  I have discussed the post-operative course to date, and the expected progress moving forward.  The patient understands what complications to be concerned about.  I will see the patient in routine follow up, or sooner if needed.    Activity plan: increase slowly.  Prentice Docker, MD 11/24/2019, 8:30 AM

## 2019-12-29 ENCOUNTER — Other Ambulatory Visit: Payer: Self-pay

## 2019-12-29 ENCOUNTER — Encounter: Payer: Self-pay | Admitting: Obstetrics and Gynecology

## 2019-12-29 ENCOUNTER — Ambulatory Visit (INDEPENDENT_AMBULATORY_CARE_PROVIDER_SITE_OTHER): Payer: BC Managed Care – PPO | Admitting: Obstetrics and Gynecology

## 2019-12-29 VITALS — BP 122/74 | Ht 69.0 in | Wt 215.0 lb

## 2019-12-29 DIAGNOSIS — N921 Excessive and frequent menstruation with irregular cycle: Secondary | ICD-10-CM

## 2019-12-29 DIAGNOSIS — Z09 Encounter for follow-up examination after completed treatment for conditions other than malignant neoplasm: Secondary | ICD-10-CM

## 2019-12-29 DIAGNOSIS — N946 Dysmenorrhea, unspecified: Secondary | ICD-10-CM

## 2019-12-29 NOTE — Progress Notes (Signed)
   Postoperative Follow-up Patient presents post op from total laparoscopic hysterectomy, bilateral salpingectomy, cystoscopy 7 weeks ago for menorrhagia with irregular cycle and dysmenorrhea.  Subjective: Patient reports marked improvement in her preop symptoms. Eating a regular diet without difficulty. The patient is not having any pain.  Activity: normal activities of daily living. She denies fevers, chills, nausea, and vomiting.  Objective: Vitals:   12/29/19 0809  BP: 122/74   Vital Signs: BP 122/74   Ht 5\' 9"  (1.753 m)   Wt 215 lb (97.5 kg)   BMI 31.75 kg/m  Constitutional: Well nourished, well developed female in no acute distress.  HEENT: normal Skin: Warm and dry.  Extremity: no edema  Abdomen: Soft, non-tender, normal bowel sounds; no bruits, organomegaly or masses. clean, dry and intact  Pelvic exam: (female chaperone present) is not limited by body habitus EGBUS: within normal limits Vagina: within normal limits and with a well-healed vaginal cuff. Stitches still visible. No erythema, induration, warmth, and tenderness.   Assessment: 44 y.o. s/p TLH/BS/Cysto progressing well  Plan: Patient has done well after surgery with no apparent complications.  I have discussed the post-operative course to date, and the expected progress moving forward.  The patient understands what complications to be concerned about.  I will see the patient in routine follow up, or sooner if needed.    Activity plan: No restriction except wait at least 2 weeks before slowly resuming intercourse.  Prentice Docker, MD 12/29/2019, 8:13 AM

## 2020-01-17 IMAGING — CR DG FOOT COMPLETE 3+V*R*
1 series · 3 of 3 positions shown · non-contrast
Comparison: None.

CLINICAL DATA: 42-year-old female with right foot pain, swelling
and erythema.

EXAM:
RIGHT FOOT COMPLETE - 3+ VIEW

[Series 1: dg foot complete right · 0.14mm/px · 3 of 3 slices shown]
[im 1/3]
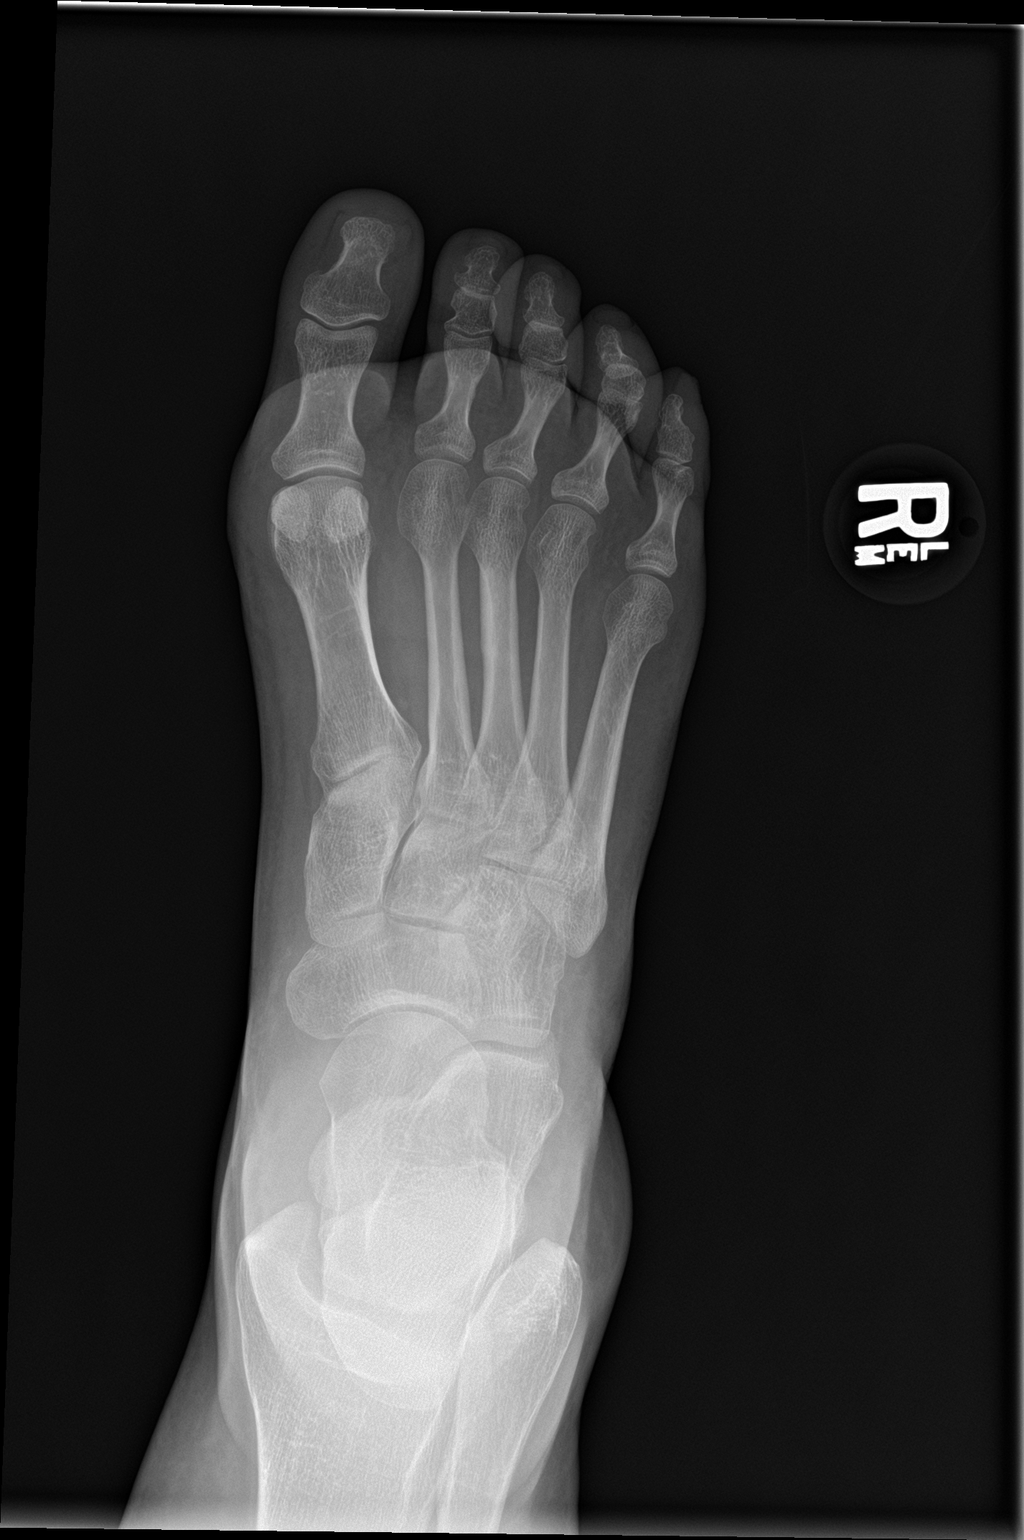
[im 2/3]
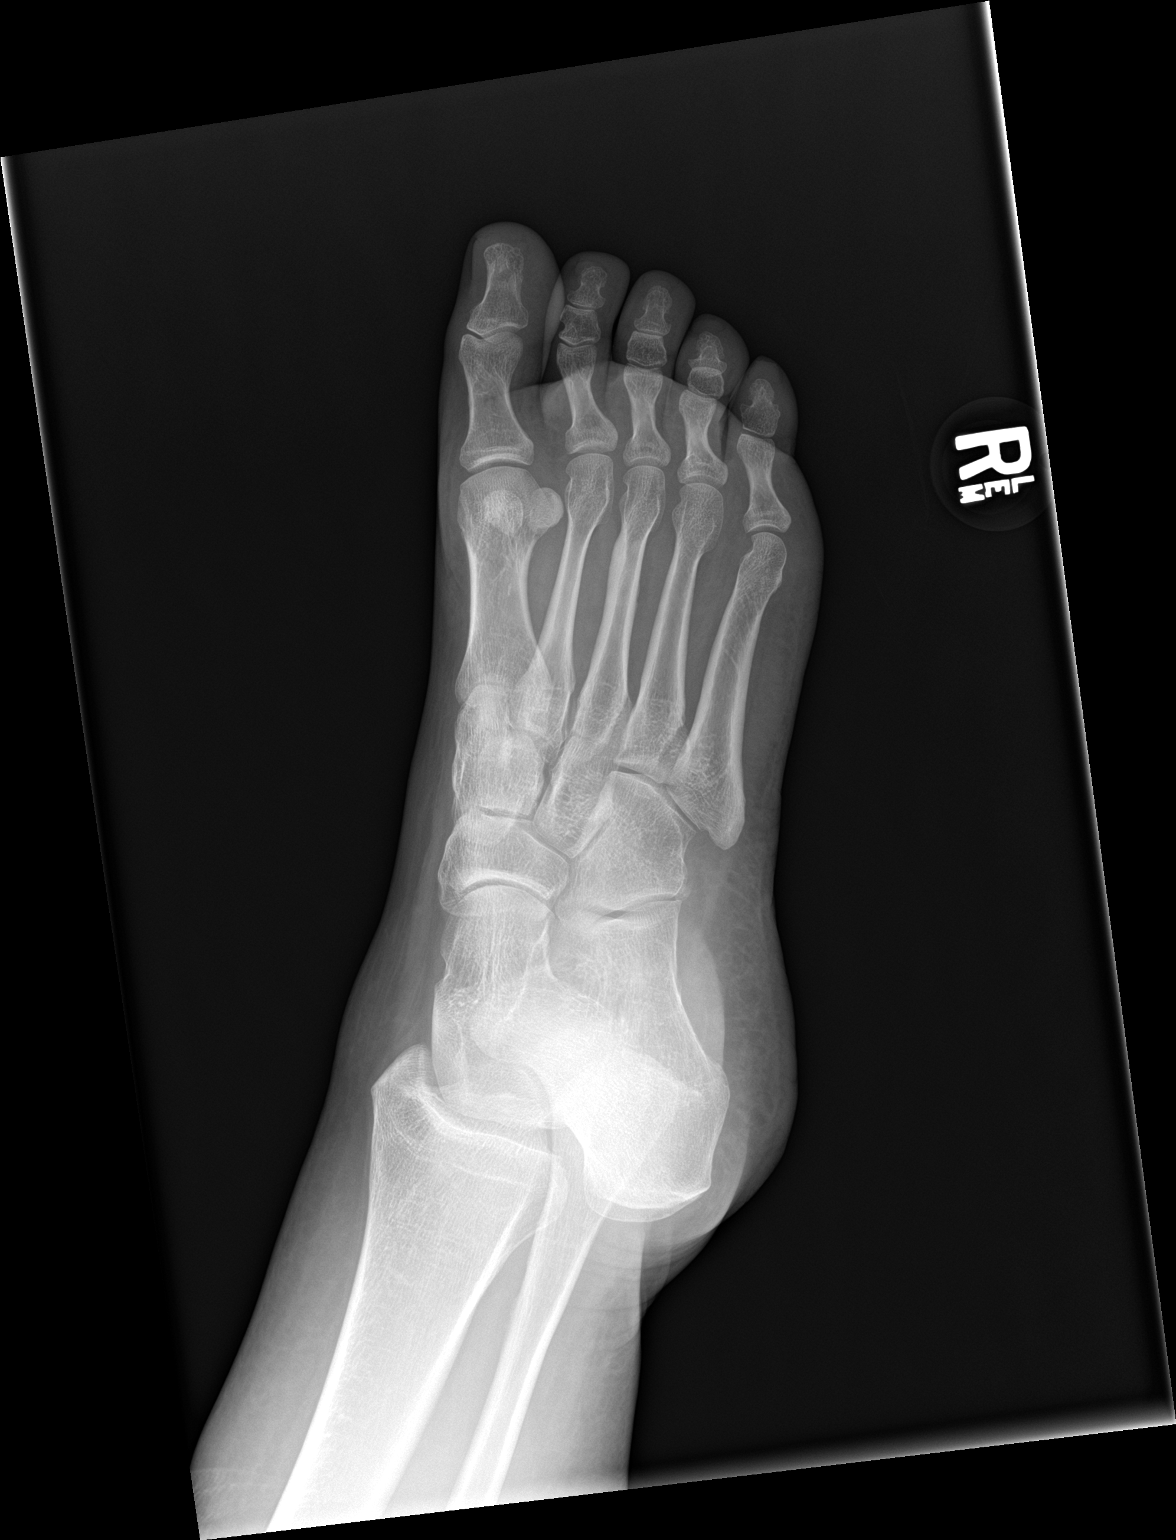
[im 3/3]
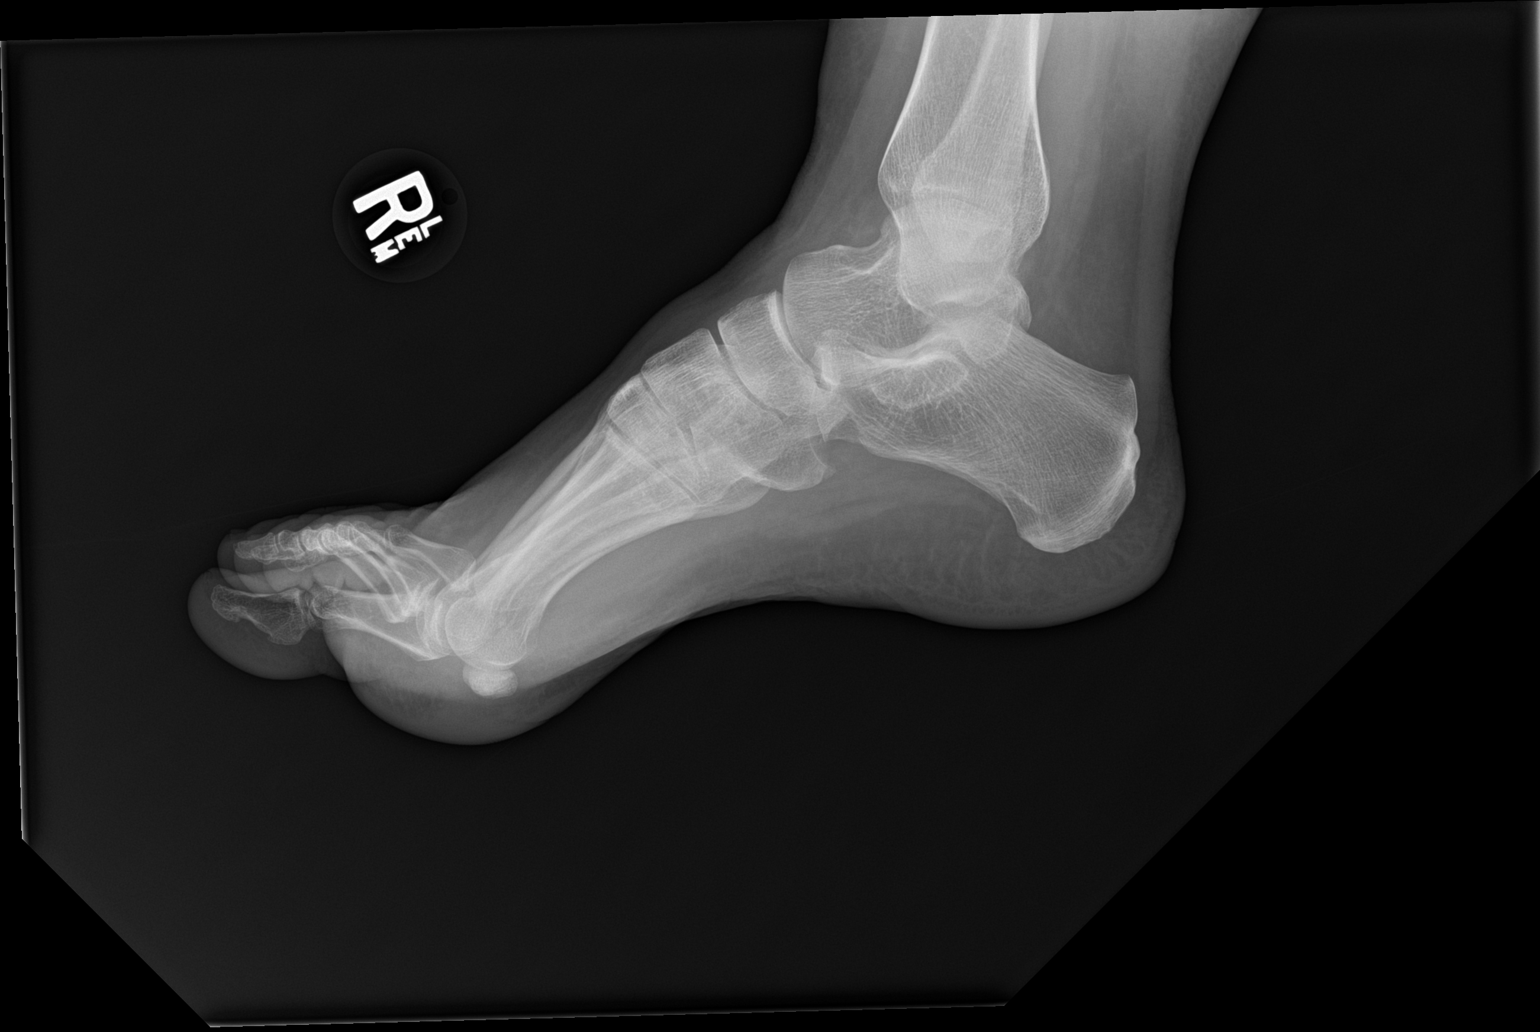

[3 of 3 positions shown; findings below may reference images not displayed]

FINDINGS: There is no evidence of fracture or dislocation. There is no
evidence of arthropathy or other focal bone abnormality. Soft
tissues are unremarkable.
IMPRESSION: Negative.

## 2020-02-13 ENCOUNTER — Telehealth: Payer: Self-pay | Admitting: Internal Medicine

## 2020-02-13 ENCOUNTER — Ambulatory Visit: Payer: BC Managed Care – PPO | Admitting: Cardiovascular Disease

## 2020-02-13 ENCOUNTER — Encounter: Payer: Self-pay | Admitting: Cardiovascular Disease

## 2020-02-13 ENCOUNTER — Other Ambulatory Visit: Payer: Self-pay

## 2020-02-13 ENCOUNTER — Encounter: Payer: Self-pay | Admitting: *Deleted

## 2020-02-13 VITALS — BP 130/100 | HR 86 | Ht 69.0 in | Wt 212.0 lb

## 2020-02-13 DIAGNOSIS — I10 Essential (primary) hypertension: Secondary | ICD-10-CM | POA: Diagnosis not present

## 2020-02-13 DIAGNOSIS — I420 Dilated cardiomyopathy: Secondary | ICD-10-CM

## 2020-02-13 DIAGNOSIS — Z720 Tobacco use: Secondary | ICD-10-CM | POA: Diagnosis not present

## 2020-02-13 DIAGNOSIS — I5181 Takotsubo syndrome: Secondary | ICD-10-CM

## 2020-02-13 MED ORDER — LISINOPRIL 5 MG PO TABS
5.0000 mg | ORAL_TABLET | Freq: Every day | ORAL | 2 refills | Status: DC | PRN
Start: 2020-02-13 — End: 2020-07-29

## 2020-02-13 NOTE — Progress Notes (Signed)
Cardiology Office Note  Date:  02/13/2020   ID:  Sarah Mills, DOB 02-May-1976, MRN [id number]  PCP:  Pcp, No   Chief Complaint  Patient presents with  . other    Pt. c/o elevated BP, left arm pain, headache and pain down right leg. Meds reviewed by the pt. verbally.     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 Has tried Tylenol ibuprofen Excedrin unable to get rid of her headache Past several measurements of her blood pressure through GYN has shown systolic pressures 366 up to 120s Typically does not run high pressure on the low-dose carvedilol 3.125 twice daily  She is concerned her blood pressures causing her headache  covid shot sept 12th 2021  Still smoking  EKG personally reviewed by myself on todays visit NSR rate 86 bpm no ST or T wave changes  Other past medical history reviewed,  2017 [facility] following episode of chest pain diaphoresis Had abnormal EKG, elevated troponin, Cardiac catheterization minimal plaque in the RPDA  otherwise normal coronary arteries.   EF was 25 to 35% by ventriculography and 40-45% by echocardiography.   She was placed on medical therapy including aspirin, statin, and beta-blocker ACE inhibitor  echocardiogram December 2017 showing an EF of 55 to 60%    PMH:   has a past medical history of Dysmenorrhea, Headache, IBS (irritable bowel syndrome), Menorrhagia, NSTEMI (non-ST elevated myocardial infarction) ([place]), Obesity, Takotsubo cardiomyopathy, and  Tobacco abuse.  PSH:    Past Surgical History:  Procedure Laterality Date  . CARDIAC CATHETERIZATION N/A 01/06/2016   Procedure: Left Heart Cath and Coronary Angiography;  Surgeon: Burnell Blanks, MD;  Location: [place] [facility];  Service: Cardiovascular;  Laterality: N/A;  . CYSTOSCOPY N/A 11/16/2019   Procedure: CYSTOSCOPY;  Surgeon: Will Bonnet, MD;  Location: [facility];  Service: Gynecology;  Laterality: N/A;  . TOTAL LAPAROSCOPIC HYSTERECTOMY WITH SALPINGECTOMY Bilateral 11/16/2019   Procedure: TOTAL LAPAROSCOPIC HYSTERECTOMY WITH BILATERIAL SALPINGECTOMY;  Surgeon: Will Bonnet, MD;  Location: [facility];  Service: Gynecology;  Laterality: Bilateral;  . TUBAL LIGATION      Current Outpatient Medications  Medication Sig Dispense Refill  . acetaminophen (TYLENOL) 500 MG tablet Take 1,000 mg by mouth every 6 (six) hours as needed for moderate pain.     Marland Kitchen aspirin EC 81 MG tablet Take 1 tablet (81 mg total) by mouth daily.    Marland Kitchen atorvastatin (LIPITOR) 40 MG tablet Take 1 tablet (40 mg total) by mouth daily. 90 tablet 3  . carvedilol (COREG) 3.125 MG tablet Take 1 tablet (3.125 mg total) by mouth 2 (two) times daily. 180 tablet 3  . ondansetron (ZOFRAN ODT) 4 MG disintegrating tablet Take 1 tablet (4 mg total) by mouth every 6 (six) hours as needed for nausea. 20 tablet 0  . lisinopril (ZESTRIL) 5 MG tablet Take 1 tablet (5 mg total) by mouth daily as needed (for diastolic blood pressure (bottom number) greater than 90.). 30 tablet 2  . oxyCODONE-acetaminophen (PERCOCET)  5-325 MG tablet Take 1 tablet by mouth every 6 (six) hours as needed for severe pain. (Patient not taking: Reported on 02/13/2020) 28 tablet 0   No current facility-administered medications for this visit.     Allergies:   Patient has no known allergies.   Social History:  The patient  reports that she has been smoking cigarettes. She has a 7.00 pack-year smoking history. She has never used smokeless  tobacco. She reports that she does not drink alcohol and does not use drugs.   Family History:   family history includes CAD in her father and maternal grandfather; Heart attack (age of onset: 61) in her father; Heart disease in her father.    Review of Systems: Review of Systems  Constitutional: Negative.   HENT: Negative.   Respiratory: Negative.   Cardiovascular: Negative.   Gastrointestinal: Negative.   Musculoskeletal: Negative.        Left arm pain  Neurological: Positive for headaches.  Psychiatric/Behavioral: Negative.   All other systems reviewed and are negative.    PHYSICAL EXAM: VS:  BP (!) 130/100 (BP Location: Right Arm, Patient Position: Sitting, Cuff Size: Large)   Pulse 86   Ht 5\' 9"  (1.753 m)   Wt 212 lb (96.2 kg)   SpO2 99%   BMI 31.31 kg/m  , BMI Body mass index is 31.31 kg/m. GEN: Well nourished, well developed, in no acute distress HEENT: normal Neck: no JVD, carotid bruits, or masses Cardiac: RRR; no murmurs, rubs, or gallops,no edema  Respiratory:  clear to auscultation bilaterally, normal work of breathing GI: soft, nontender, nondistended, + BS MS: no deformity or atrophy Skin: warm and dry, no rash Neuro:  Strength and sensation are intact Psych: euthymic mood, full affect   Recent Labs: 10/20/2019: Hemoglobin 12.5; Platelets 290 11/14/2019: ALT 8; BUN 15; Creatinine, Ser 0.85; Potassium 4.1; Sodium 137    Lipid Panel Lab Results  Component Value Date   CHOL 150 01/07/2016   HDL 32 (L) 01/07/2016   LDLCALC 99 01/07/2016   TRIG 96 01/07/2016      Wt Readings from Last 3 Encounters:  02/13/20 212 lb (96.2 kg)  12/29/19 215 lb (97.5 kg)  11/24/19 216 lb (98 kg)       ASSESSMENT AND PLAN:  Problem List Items Addressed This Visit    Tobacco use    Other Visit Diagnoses    Dilated cardiomyopathy ([place])    -  Primary   Relevant Medications   lisinopril (ZESTRIL) 5 MG tablet   Takotsubo cardiomyopathy       Relevant Medications    lisinopril (ZESTRIL) 5 MG tablet   Other Relevant Orders   EKG 12-Lead   Essential hypertension       Relevant Medications   lisinopril (ZESTRIL) 5 MG tablet     Headache on today's visit 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 Unable to get her headache to go away with over-the-counter medications Associated hypertension past day or so since she has been checking Anxious in the setting of left shoulder discomfort EKG reassuring, Has been off work now for 4 days Saturday through Tuesday Work note provided for out of work Monday and Tuesday at her request If headache persists, she may need primary care, urgent care Recommend she try to establish care with a primary care physician  Dilated cardiomyopathy/stress cardiomyopathy Atypical presentation EKG benign Recommend she take extra Coreg, lisinopril 5 mg as needed for high blood pressure Left shoulder discomfort atypical  in nature    Total encounter time more than 25 minutes  Greater than 50% was spent in counseling and coordination of care with the patient    Signed, Esmond Plants, M.D., Ph.D. [place], [place]

## 2020-02-13 NOTE — Telephone Encounter (Signed)
Spoke with patient. Headache started on sautrday - took Tylenol and that did help. Stayed out of work yesterday. This morning while getting ready, she developed sharp left arm pain and sweating. BP was 128/105, HR 120. Denies pain in chest or shortness of breath. Takotsuba in 2017 with heart cath which showed mild nonobstructive CAD. Last echo 2017 EF 55-60%.  Arm pain and sweating have subsided at the moment.  She does not want to go to the ER because she fears getting COVID and the long waits. Dr Rockey Situ, DOD, has opening at 10:20 am.  She is agreeable to appointment.  Advised her if her symptoms worsen or develops new symptoms, she should go straight the the ER instead of to [place].

## 2020-02-13 NOTE — Patient Instructions (Addendum)
Medication Instructions:  For high pressure: Take coreg extra pill as needed (no more than 4 a day) You can also take lisinopril 5 mg as needed for high pressure (diastolic blood pressure >95)  If you need a refill on your cardiac medications before your next appointment, please call your pharmacy.    Lab work: No new labs needed   If you have labs (blood work) drawn today and your tests are completely normal, you will receive your results only by: Marland Kitchen MyChart Message (if you have MyChart) OR . A paper copy in the mail If you have any lab test that is abnormal or we need to change your treatment, we will call you to review the results.   Testing/Procedures: No new testing needed   Follow-Up: At [facility], you and your health needs are our priority.  As part of our continuing mission to provide you with exceptional heart care, we have created designated Provider Care Teams.  These Care Teams include your primary Cardiologist (physician) and Advanced Practice Providers (APPs -  Physician Assistants and Nurse Practitioners) who all work together to provide you with the care you need, when you need it.  . You will need a follow up appointment 6 months . F/u with Dr. Saunders Revel  . Providers on your designated Care Team:   . Murray Hodgkins, NP . Christell Faith, PA-C . Marrianne Mood, PA-C  Any Other Special Instructions Will Be Listed Below (If Applicable).  COVID-19 Vaccine Information can be found at: [email or web address].uk For questions related to vaccine distribution or appointments, please email [email or web address] or call [phone].

## 2020-02-13 NOTE — Telephone Encounter (Signed)
Pt c/o BP issue: STAT if pt c/o blurred vision, one-sided weakness or slurred speech  1. What are your last 5 BP readings?   128/105 pulse 120    2. Are you having any other symptoms (ex. Dizziness, headache, blurred vision, passed out)? Headache left arm pain   3. What is your BP issue? Wants asap ov to be checked

## 2020-05-09 ENCOUNTER — Ambulatory Visit: Payer: BC Managed Care – PPO | Admitting: Internal Medicine

## 2020-07-28 ENCOUNTER — Other Ambulatory Visit: Payer: Self-pay | Admitting: Cardiovascular Disease

## 2020-08-14 ENCOUNTER — Ambulatory Visit: Payer: BC Managed Care – PPO | Admitting: Internal Medicine

## 2020-08-14 NOTE — Progress Notes (Deleted)
Follow-up Outpatient Visit Date: 08/14/2020  Primary Care Provider: Pcp, No No address on file  Chief Complaint: ***  HPI:  Sarah Mills is a 45 y.o. female with history of minimal coronary artery plaquing, stress-induced cardiomyopathy, and tobacco abuse, who presents for follow-up of cardiomyopathy.  She was last seen by Dr. Rockey Situ on 02/13/2020, at which time she was concerned about headaches and elevated blood pressure.  She was advised to take additional carvedilol as well as as needed lisinopril for elevated diastolic blood pressures.  --------------------------------------------------------------------------------------------------  Past Medical History:  Diagnosis Date  . Dysmenorrhea   . Headache   . IBS (irritable bowel syndrome)   . Menorrhagia   . NSTEMI (non-ST elevated myocardial infarction) ([place])    a. 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  . Obesity   . Takotsubo cardiomyopathy    a. 12/2015 Cath: Nl cors - ? plaque in small RPDA.  EF 25-35%; b. 12/2015 Echo: EF 40-45%; c. 04/2016 Echo: EF 55-60%, no rwma. Nl RV fxn.  . Tobacco abuse    Past Surgical History:  Procedure Laterality Date  . CARDIAC CATHETERIZATION N/A 01/06/2016   Procedure: Left Heart Cath and Coronary Angiography;  Surgeon: Burnell Blanks, MD;  Location: [place] [facility];  Service: Cardiovascular;  Laterality: N/A;  . CYSTOSCOPY N/A 11/16/2019   Procedure: CYSTOSCOPY;  Surgeon: Will Bonnet, MD;  Location: [facility];  Service: Gynecology;  Laterality: N/A;  . TOTAL LAPAROSCOPIC HYSTERECTOMY WITH SALPINGECTOMY Bilateral 11/16/2019   Procedure: TOTAL LAPAROSCOPIC HYSTERECTOMY WITH BILATERIAL SALPINGECTOMY;  Surgeon: Will Bonnet, MD;  Location: [facility];  Service: Gynecology;  Laterality: Bilateral;  . TUBAL LIGATION      No outpatient medications have been marked as taking for the 08/14/20 encounter (Appointment) with Jillann Charette, Harrell Gave, MD.    Allergies:  Patient has no known allergies.  Social History   Tobacco Use  . Smoking status: Current Some Day Smoker    Packs/day: 0.25    Years: 28.00    Pack years: 7.00    Types: Cigarettes  . Smokeless tobacco: Never Used  . Tobacco comment: a pack lastes 4 days, pt trying to quit  Vaping Use  . Vaping Use: Never used  Substance Use Topics  . Alcohol use: No  . Drug use: No    Family History  Problem Relation Age of Onset  . CAD Father   . Heart disease Father        Pacemaker and ICD; died in his 17's  . Heart attack Father 17  . CAD Maternal Grandfather     Review of Systems: A 12-system review of systems was performed and was negative except as noted in the HPI.  --------------------------------------------------------------------------------------------------  Physical Exam: There were no vitals taken for this visit.  General:  NAD. Neck: No JVD or HJR. Lungs: Clear to auscultation bilaterally without wheezes or crackles. Heart: Regular rate and rhythm without murmurs, rubs, or gallops. Abdomen: Soft, nontender, nondistended. Extremities: No lower extremity edema.  EKG:  ***  Lab Results  Component Value Date   WBC 6.6 10/20/2019   HGB 12.5 10/20/2019   HCT 37.5 10/20/2019   MCV 92.8 10/20/2019   PLT 290 10/20/2019    Lab Results  Component Value Date   NA 137 11/14/2019   K 4.1 11/14/2019   CL 104 11/14/2019   CO2 25 11/14/2019   BUN 15 11/14/2019   CREATININE 0.85 11/14/2019   GLUCOSE 94 11/14/2019   ALT 8  11/14/2019    Lab Results  Component Value Date   CHOL 150 01/07/2016   HDL 32 (L) 01/07/2016   LDLCALC 99 01/07/2016   TRIG 96 01/07/2016   CHOLHDL 4.7 01/07/2016    --------------------------------------------------------------------------------------------------  ASSESSMENT AND PLAN: Harrell Gave Kaleiyah Polsky, MD 08/14/2020 7:01 AM

## 2020-08-15 ENCOUNTER — Encounter: Payer: Self-pay | Admitting: Internal Medicine

## 2020-10-02 ENCOUNTER — Telehealth: Payer: Self-pay | Admitting: Internal Medicine

## 2020-10-02 NOTE — Telephone Encounter (Signed)
Patient has been contacted at least 3 times for a recall, recall has been removed

## 2020-10-26 ENCOUNTER — Other Ambulatory Visit: Payer: Self-pay | Admitting: Family

## 2020-11-25 ENCOUNTER — Other Ambulatory Visit: Payer: Self-pay | Admitting: Cardiovascular Disease

## 2020-12-30 ENCOUNTER — Encounter: Payer: Self-pay | Admitting: Emergency Medicine

## 2020-12-30 ENCOUNTER — Other Ambulatory Visit: Payer: Self-pay

## 2020-12-30 ENCOUNTER — Emergency Department: Payer: BC Managed Care – PPO

## 2020-12-30 DIAGNOSIS — Z20822 Contact with and (suspected) exposure to covid-19: Secondary | ICD-10-CM | POA: Diagnosis not present

## 2020-12-30 DIAGNOSIS — R0789 Other chest pain: Secondary | ICD-10-CM | POA: Diagnosis present

## 2020-12-30 DIAGNOSIS — F17210 Nicotine dependence, cigarettes, uncomplicated: Secondary | ICD-10-CM | POA: Insufficient documentation

## 2020-12-30 DIAGNOSIS — Z7982 Long term (current) use of aspirin: Secondary | ICD-10-CM | POA: Insufficient documentation

## 2020-12-30 LAB — BASIC METABOLIC PANEL
Anion gap: 5 (ref 5–15)
BUN: 15 mg/dL (ref 6–20)
CO2: 28 mmol/L (ref 22–32)
Calcium: 8.5 mg/dL — ABNORMAL LOW (ref 8.9–10.3)
Chloride: 106 mmol/L (ref 98–111)
Creatinine, Ser: 0.93 mg/dL (ref 0.44–1.00)
GFR, Estimated: >60 mL/min (ref >60)
Glucose, Bld: 101 mg/dL — ABNORMAL HIGH (ref 70–99)
Potassium: 4.3 mmol/L (ref 3.5–5.1)
Sodium: 139 mmol/L (ref 135–145)

## 2020-12-30 LAB — CBC
HCT: 42.7 % (ref 36.0–46.0)
Hemoglobin: 14.4 g/dL (ref 12.0–15.0)
MCH: 32.2 pg (ref 26.0–34.0)
MCHC: 33.7 g/dL (ref 30.0–36.0)
MCV: 95.5 fL (ref 80.0–100.0)
Platelets: 262 10*3/uL (ref 150–400)
RBC: 4.47 MIL/uL (ref 3.87–5.11)
RDW: 13.8 % (ref 11.5–15.5)
WBC: 8.2 10*3/uL (ref 4.0–10.5)
nRBC: 0 % (ref 0.0–0.2)

## 2020-12-30 LAB — TROPONIN I (HIGH SENSITIVITY)
Troponin I (High Sensitivity): <2 ng/L (ref <18)
Troponin I (High Sensitivity): <2 ng/L (ref <18)

## 2020-12-30 NOTE — ED Triage Notes (Signed)
Pt presents to the ED via POV, pt states that she has been having centralized CP that radiates to her L shoulder since 0200 pt states that it woke her up out of her sleep. Normally, she can lay down and it he pain goes away but it didn't go away. Pt denies NVD. Has SOB upon exertion. Pt has a hx of a MI in 2018 but did not need stents to be placed. Pt has a hx HTN. Denies blood thinners. Pt is A&OX4 and NAD.

## 2020-12-31 ENCOUNTER — Emergency Department
Admission: EM | Admit: 2020-12-31 | Discharge: 2020-12-31 | Disposition: A | Discharge status: Home / Self Care | Payer: BC Managed Care – PPO | Attending: Emergency Medicine | Admitting: Emergency Medicine

## 2020-12-31 ENCOUNTER — Other Ambulatory Visit: Payer: Self-pay

## 2020-12-31 DIAGNOSIS — R079 Chest pain, unspecified: Secondary | ICD-10-CM

## 2020-12-31 LAB — RESP PANEL BY RT-PCR (FLU A&B, COVID) ARPGX2
Influenza A by PCR: NEGATIVE
Influenza B by PCR: NEGATIVE
SARS Coronavirus 2 by RT PCR: NEGATIVE

## 2020-12-31 LAB — D-DIMER, QUANTITATIVE: D-Dimer, Quant: <0.27 ug/mL-FEU (ref 0.00–0.50)

## 2020-12-31 MED ORDER — ASPIRIN 81 MG PO CHEW
324.0000 mg | CHEWABLE_TABLET | Freq: Once | ORAL | Status: AC
  Administered 2020-12-31: 324 mg via ORAL
  Filled 2020-12-31: qty 4

## 2020-12-31 MED ORDER — NITROGLYCERIN 0.4 MG SL SUBL: 0.4000 mg | SUBLINGUAL_TABLET | SUBLINGUAL | 0 refills | Status: DC | PRN

## 2020-12-31 MED ORDER — ALUM & MAG HYDROXIDE-SIMETH 200-200-20 MG/5ML PO SUSP
30.0000 mL | Freq: Once | ORAL | Status: AC
  Administered 2020-12-31: 30 mL via ORAL
  Filled 2020-12-31: qty 30

## 2020-12-31 MED ORDER — NITROGLYCERIN 0.4 MG SL SUBL
0.4000 mg | SUBLINGUAL_TABLET | Freq: Once | SUBLINGUAL | Status: AC
  Administered 2020-12-31: 0.4 mg via SUBLINGUAL

## 2020-12-31 NOTE — ED Provider Notes (Signed)
[facility] Emergency Department Provider Note  ____________________________________________  Time seen: Approximately 2:08 AM  I have reviewed the triage vital signs and the nursing notes.   HISTORY  Chief Complaint Chest Pain   HPI Sarah Mills is a 45 y.o. female with a history of Takotsubo cardiomyopathy in 2017, mild nonobstructive CAD, menorrhagia, IBS, smoking who presents for evaluation of chest pain.  Patient reports that since having her episode of Takotsubo in 2017 she has similar pain once every couple of months.  This current episode has been persistent throughout the day which prompted her visit to the ER.  Patient reports that her husband lost his first wife to a heart attack and was very concerned and asked her to come to the ED to be evaluated.  She describes the pain as a heaviness in her chest that woke her up from her sleep at 2 AM.  The pain was severe earlier today but now is more mild.  The pain has been constant.  She does report intermittent radiation down the left arm.  She denies shortness of breath, dizziness, nausea, vomiting, abdominal pain, cough, fever.  She denies any personal or family history of PE or DVT, no recent travel immobilization, no leg pain or swelling, no hemoptysis or exogenous hormones.  She has not taken anything at home for the pain.  The pain is not worse with ambulation or postprandially.  Patient does report that she has been under a lot of stress last week and had to cover several shifts at work from other people who called out.  She reports that she has been very tired, has not been able to rest over the weekend.  Past Medical History:  Diagnosis Date   Dysmenorrhea    Headache    IBS (irritable bowel syndrome)    Menorrhagia    NSTEMI (non-ST elevated myocardial infarction) ([place])    a. 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   Obesity    Takotsubo cardiomyopathy    a. 12/2015  Cath: Nl cors - ? plaque in small RPDA.  EF 25-35%; b. 12/2015 Echo: EF 40-45%; c. 04/2016 Echo: EF 55-60%, no rwma. Nl RV fxn.   Tobacco abuse     Patient Active Problem List   Diagnosis Date Noted   Menorrhagia with irregular cycle 11/16/2019   Dysmenorrhea 11/16/2019   Demand ischemia ([place]) 01/07/2016   Tobacco use 01/06/2016   Takotsubo syndrome     Past Surgical History:  Procedure Laterality Date   CARDIAC CATHETERIZATION N/A 01/06/2016   Procedure: Left Heart Cath and Coronary Angiography;  Surgeon: Burnell Blanks, MD;  Location: [place] [facility];  Service: Cardiovascular;  Laterality: N/A;   CYSTOSCOPY N/A 11/16/2019   Procedure: CYSTOSCOPY;  Surgeon: Will Bonnet, MD;  Location: [facility];  Service: Gynecology;  Laterality: N/A;   TOTAL LAPAROSCOPIC HYSTERECTOMY WITH SALPINGECTOMY Bilateral 11/16/2019   Procedure: TOTAL LAPAROSCOPIC HYSTERECTOMY WITH BILATERIAL SALPINGECTOMY;  Surgeon: Will Bonnet, MD;  Location: [facility];  Service: Gynecology;  Laterality: Bilateral;   TUBAL LIGATION      Prior to Admission medications   Medication Sig Start Date End Date Taking? Authorizing Provider  nitroGLYCERIN (NITROSTAT) 0.4 MG SL tablet Place 1 tablet (0.4 mg total) under the tongue every 5 (five) minutes as needed for chest pain. 12/31/20 12/31/21 Yes Ashvin Adelson, [place], MD  acetaminophen (TYLENOL) 500 MG tablet Take 1,000 mg by mouth every 6 (six) hours as needed for moderate pain.  [provider]  aspirin EC 81 MG tablet Take 1 tablet (81 mg total) by mouth daily. 11/16/19   Will Bonnet, MD  atorvastatin (LIPITOR) 40 MG tablet Take 1 tablet (40 mg total) by mouth daily. Please call to schedule office visit for further refills. Thank you! 10/28/20   Minna Merritts, MD  carvedilol (COREG) 3.125 MG tablet Take 1 tablet (3.125 mg total) by mouth 2 (two) times daily with a meal. Please call to schedule office visit for further refills. Thank you! 10/28/20    Minna Merritts, MD  lisinopril (ZESTRIL) 5 MG tablet TAKE 1 TABLET BY MOUTH DAILY AS NEEDED FOR DIASTOLIC BLOOD PRESSURE( BOTTOM NUMBER) GREATER THAN 90 07/29/20   Gollan, Kathlene November, MD  ondansetron (ZOFRAN ODT) 4 MG disintegrating tablet Take 1 tablet (4 mg total) by mouth every 6 (six) hours as needed for nausea. 11/16/19   Will Bonnet, MD  oxyCODONE-acetaminophen (PERCOCET) 5-325 MG tablet Take 1 tablet by mouth every 6 (six) hours as needed for severe pain. Patient not taking: Reported on 02/13/2020 11/16/19   Will Bonnet, MD    Allergies Patient has no known allergies.  Family History  Problem Relation Age of Onset   CAD Father    Heart disease Father        Pacemaker and ICD; died in his 6's   Heart attack Father 41   CAD Maternal Grandfather     Social History Social History   Tobacco Use   Smoking status: Some Days    Packs/day: 0.25    Years: 28.00    Pack years: 7.00    Types: Cigarettes   Smokeless tobacco: Never   Tobacco comments:    a pack lastes 4 days, pt trying to quit  Vaping Use   Vaping Use: Never used  Substance Use Topics   Alcohol use: No   Drug use: No    Review of Systems  Constitutional: Negative for fever. Eyes: Negative for visual changes. ENT: Negative for sore throat. Neck: No neck pain  Cardiovascular: + chest pain. Respiratory: Negative for shortness of breath. Gastrointestinal: Negative for abdominal pain, vomiting or diarrhea. Genitourinary: Negative for dysuria. Musculoskeletal: Negative for back pain. Skin: Negative for rash. Neurological: Negative for headaches, weakness or numbness. Psych: No SI or HI  ____________________________________________   PHYSICAL EXAM:  VITAL SIGNS: ED Triage Vitals [12/30/20 1841]  Enc Vitals Group     BP (!) 142/96     Pulse Rate 80     Resp 20     Temp 98.3 F (36.8 C)     Temp Source Oral     SpO2 100 %     Weight 200 lb (90.7 kg)     Height '5\' 9"'$  (1.753 m)     Head  Circumference      Peak Flow      Pain Score 5     Pain Loc      Pain Edu?      Excl. in [place]?     Constitutional: Alert and oriented. Well appearing and in no apparent distress. HEENT:      Head: Normocephalic and atraumatic.         Eyes: Conjunctivae are normal. Sclera is non-icteric.       Mouth/Throat: Mucous membranes are moist.       Neck: Supple with no signs of meningismus. Cardiovascular: Regular rate and rhythm. No murmurs, gallops, or rubs. 2+ symmetrical distal pulses are present in  all extremities. No JVD. Respiratory: Normal respiratory effort. Lungs are clear to auscultation bilaterally.  Gastrointestinal: Soft, non tender, and non distended with positive bowel sounds. No rebound or guarding. Genitourinary: No CVA tenderness. Musculoskeletal:  No edema, cyanosis, or erythema of extremities. Neurologic: Normal speech and language. Face is symmetric. Moving all extremities. No gross focal neurologic deficits are appreciated. Skin: Skin is warm, dry and intact. No rash noted. Psychiatric: Mood and affect are normal. Speech and behavior are normal.  ____________________________________________   LABS (all labs ordered are listed, but only abnormal results are displayed)  Labs Reviewed  BASIC METABOLIC PANEL - Abnormal; Notable for the following components:      Result Value   Glucose, Bld 101 (*)    Calcium 8.5 (*)    All other components within normal limits  RESP PANEL BY RT-PCR (FLU A&B, COVID) ARPGX2  CBC  D-DIMER, QUANTITATIVE  POC URINE PREG, ED  TROPONIN I (HIGH SENSITIVITY)  TROPONIN I (HIGH SENSITIVITY)   ____________________________________________  EKG  ED ECG REPORT I, Rudene Re, the attending physician, personally viewed and interpreted this ECG.  6:36PM - Normal sinus rhythm, rate of 81, normal intervals, normal axis, no ST elevations or depressions.  Normal EKG peer  01:43AM -sinus bradycardia with a rate of 57, normal intervals,  normal axis, no ST elevations or depressions ____________________________________________  RADIOLOGY  I have personally reviewed the images performed during this visit and I agree with the Radiologist's read.   Interpretation by Radiologist:  DG Chest 2 View  Result Date: 12/30/2020 CLINICAL DATA:  Chest pain EXAM: CHEST - 2 VIEW COMPARISON:  01/06/2016 FINDINGS: The heart size and mediastinal contours are within normal limits. Both lungs are clear. The visualized skeletal structures are unremarkable. IMPRESSION: No active cardiopulmonary disease. Electronically Signed   By: Rolm Baptise M.D.   On: 12/30/2020 19:02     ____________________________________________   PROCEDURES  Procedure(s) performed: yes .1-3 Lead EKG Interpretation  Date/Time: 12/31/2020 2:14 AM Performed by: Rudene Re, MD Authorized by: Rudene Re, MD     Interpretation: normal     ECG rate assessment: normal     Rhythm: sinus rhythm     Ectopy: none     Conduction: normal   Critical Care performed:  None ____________________________________________   INITIAL IMPRESSION / ASSESSMENT AND PLAN / ED COURSE  45 y.o. female with a history of Takotsubo cardiomyopathy in 2017, mild nonobstructive CAD, menorrhagia, IBS, smoking who presents for evaluation of chest pain.  Patient with constant chest pressure and intermittent radiation to the left arm that started 24 hours ago.  She is otherwise well-appearing and in no distress with normal vital signs.  Heart regular rate and rhythm with no murmurs, lungs are clear to auscultation, pulses are strong in all 4 extremities, no asymmetric leg swelling.  Ddx ACS, gerd, MSK, stress, PE, dissection.   2 EKGs have been done in the emergency department with no signs of ischemia.  2 high-sensitivity troponins are negative.  Chest x-ray visualized by me with no acute findings, confirmed by radiology.  Chemistry panel with no acute findings.  CBC is within  normal limits.  Since patient continues to complain of persistent pain a D-dimer was sent.  We will give a full dose aspirin, GI cocktail, and a nitro to see if patient's symptoms resolve.  Patient placed on telemetry for close monitoring of her vital signs.  Old medical records review including patient's catheterization from 2017.  _________________________ 4:00 AM on 12/31/2020 -----------------------------------------  D-dimer is negative.  After GI cocktail, aspirin, and sublingual nitro patient reports that her pain is now gone.  We will give her prescription for nitroglycerin.  Instructed the patient may use another 1 sublingual when she has similar chest pain as long as her blood pressure is above [id number] systolic.  I explained to her that nitro dropped her blood pressure and if she takes with the pressure that is less than [id number] systolic she may have a syncopal event.  Recommended calling her cardiologist first thing in the morning for an appointment.  I did discuss patient may need a stress test or left heart cath for further evaluation.  In the meantime recommended return to the emergency room for chest pain and responsive to nitro, shortness of breath, syncope.  These recommendations and results were discussed with patient and her husband was at bedside.      _____________________________________________ Please note:  Patient was evaluated in Emergency Department today for the symptoms described in the history of present illness. Patient was evaluated in the context of the global COVID-19 pandemic, which necessitated consideration that the patient might be at risk for infection with the SARS-CoV-2 virus that causes COVID-19. Institutional protocols and algorithms that pertain to the evaluation of patients at risk for COVID-19 are in a state of rapid change based on information released by regulatory bodies including the CDC and federal and state organizations. These policies and algorithms were  followed during the patient's care in the ED.  Some ED evaluations and interventions may be delayed as a result of limited staffing during the pandemic.    Controlled Substance Database was reviewed by me. ____________________________________________   FINAL CLINICAL IMPRESSION(S) / ED DIAGNOSES   Final diagnoses:  Nonspecific chest pain      NEW MEDICATIONS STARTED DURING THIS VISIT:  ED Discharge Orders          Ordered    nitroGLYCERIN (NITROSTAT) 0.4 MG SL tablet  Every 5 min PRN        12/31/20 0358             Note:  This document was prepared using Dragon voice recognition software and may include unintentional dictation errors.    Alfred Levins, [place], MD 12/31/20 [phone]

## 2020-12-31 NOTE — Discharge Instructions (Addendum)
Continue daily aspirin.  If you have the chest pain again, check your blood pressure.  If the top number is greater than 130 you may try 1 nitro.  If that makes the pain go away it is okay to stay home.  If you continue to have pain please return to the emergency room.  Make sure to call your cardiologist first thing in the morning as you might need further evaluation of the arteries of your heart with a stress test or catheterization.

## 2021-01-15 NOTE — Progress Notes (Signed)
Cardiology Office Note:    Date:  01/16/2021   ID:  Sarah Mills, DOB [birth year], MRN [id number]  PCP:  Sarah Mills No  [facility] Cardiologist:  Nelva Bush, MD  [place] Electrophysiologist:  None   Referring MD: No ref. provider found   Chief Complaint: ER follow-up  History of Present Illness:    Sarah Mills is a 45 y.o. female with a hx of Takotsubo cardiomyopathy, IBS, menorrhagia, minimal nonobstructive CAD, family history of CAD, tobacco use who presents for hospital follow-up.  Cardiac history dates back to August 2017, when she presented to [facility] for chest pressure and shortness of breath.  She was admitted for non-STEMI and underwent cath showing minimal plaque in the RPDA otherwise normal coronaries.  EF was 25 to 35% by ventriculography in 4045% by echo.  She was placed on aspirin, statin, beta-blocker, ACE. Follow-up echo December 2017 showed EF 55 to 60% without wall motion abnormalities.  Patient was seen 02/13/2020 for headache in the setting of elevated blood pressure.  Recommended she take extra Coreg or lisinopril.  Patient was recently seen in the ER 12/31/2020 for chest pain. Troponin negative x2. Pressure 142/96.  Chest x-ray unremarkable. EKG with no acute findings. D-dimer negative. She was given GI cocktail, aspirin, sublingual nitro.  Pain resolved with this.  Today, chest pain started at 2 AM in the morning day of ER visit 12/31/20. Thought it was heartburn since she had enchiladas the night before. Also has some left arm pain. IT was centralized chest pain, it was a tightness and pressure. Radiated to the arm. Also felt short of breath. When she walks the pain gets worse. She still feels shortness of breath when she walks, in fact the shortness of breath on exertion started before the chest pain. She denies any further chest pain episodes. She has dependent edema. No palpitations. She is trying to quit smoking, only a couple daily. 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 Seh changed her diet and started drinking more water. EKG today shows NSR 79bpm with no acute changes.    Past Medical History:  Diagnosis Date   Dysmenorrhea    Headache    IBS (irritable bowel syndrome)    Menorrhagia    NSTEMI (non-ST elevated myocardial infarction) ([place])    a. 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   Obesity    Takotsubo cardiomyopathy    a. 12/2015 Cath: Nl cors - ? plaque in small RPDA.  EF 25-35%; b. 12/2015 Echo: EF 40-45%; c. 04/2016 Echo: EF 55-60%, no rwma. Nl RV fxn.   Tobacco abuse     Past Surgical History:  Procedure Laterality Date   CARDIAC CATHETERIZATION N/A 01/06/2016   Procedure: Left Heart Cath and Coronary Angiography;  Surgeon: Burnell Blanks, MD;  Location: [place] [facility];  Service: Cardiovascular;  Laterality: N/A;   CYSTOSCOPY N/A 11/16/2019   Procedure: CYSTOSCOPY;  Surgeon: Will Bonnet, MD;  Location: [facility];  Service: Gynecology;  Laterality: N/A;   TOTAL LAPAROSCOPIC HYSTERECTOMY WITH SALPINGECTOMY Bilateral 11/16/2019   Procedure: TOTAL LAPAROSCOPIC HYSTERECTOMY WITH BILATERIAL SALPINGECTOMY;  Surgeon: Will Bonnet, MD;  Location: [facility];  Service: Gynecology;  Laterality: Bilateral;   TUBAL LIGATION      Current Medications: Current Meds  Medication Sig   acetaminophen (TYLENOL) 500 MG tablet Take 1,000 mg by mouth every 6 (six) hours as needed for moderate pain.    aspirin EC 81 MG tablet Take 1 tablet (  81 mg total) by mouth daily.   atorvastatin (LIPITOR) 40 MG tablet Take 1 tablet (40 mg total) by mouth daily. Please call to schedule office visit for further refills. Thank you!   lisinopril (ZESTRIL) 5 MG tablet TAKE 1 TABLET BY MOUTH DAILY AS NEEDED FOR DIASTOLIC BLOOD PRESSURE( BOTTOM NUMBER) GREATER THAN 90   metoprolol tartrate (LOPRESSOR) 100 MG tablet Take 1 tablet (100 mg total) by mouth once for 1 dose. Take TWO hours prior to CT procedure   nitroGLYCERIN  (NITROSTAT) 0.4 MG SL tablet Place 1 tablet (0.4 mg total) under the tongue every 5 (five) minutes as needed for chest pain.   [DISCONTINUED] carvedilol (COREG) 3.125 MG tablet Take 1 tablet (3.125 mg total) by mouth 2 (two) times daily with a meal. Please call to schedule office visit for further refills. Thank you!     Allergies:   Patient has no known allergies.   Social History   Socioeconomic History   Marital status: Married    Spouse name: Not on file   Number of children: Not on file   Years of education: Not on file   Highest education level: Not on file  Occupational History   Not on file  Tobacco Use   Smoking status: Some Days    Packs/day: 0.25    Years: 28.00    Pack years: 7.00    Types: Cigarettes   Smokeless tobacco: Never   Tobacco comments:    a pack lastes 4 days, pt trying to quit  Vaping Use   Vaping Use: Never used  Substance and Sexual Activity   Alcohol use: No   Drug use: No   Sexual activity: Not Currently    Birth control/protection: None  Other Topics Concern   Not on file  Social History Narrative   Lives in [place] w/ son, dtr, and grandchild.  Works @ a [employer].  Active @ work and goes for walks with her granddtr regularly.   Social Determinants of Health   Financial Resource Strain: Not on file  Food Insecurity: Not on file  Transportation Needs: Not on file  Physical Activity: Not on file  Stress: Not on file  Social Connections: Not on file     Family History: The patient's family history includes CAD in her father and maternal grandfather; Heart attack (age of onset: 78) in her father; Heart disease in her father.  ROS:   Please see the history of present illness.     All other systems reviewed and are negative.  EKGs/Labs/Other Studies Reviewed:    The following studies were reviewed today:  LHC 12/2015 RPDA lesion, 30 %stenosed. The left ventricular ejection fraction is 25-35% by visual estimate. There is moderate to  severe left ventricular systolic dysfunction. LV end diastolic pressure is mildly elevated. There is no mitral valve regurgitation.   1. Mild non-obstructive CAD.  2. The LAD and large diagonal branch have no obstructive disease. The diagonal branch is similar in caliber to the LAD. The LAD does not reach the apex. The Circumflex artery has no obstructive disease. The RCA is large and gives off a small to moderate caliber PDA branch. This branch tapers down to a small caliber vessel distally. Cannot exclude some diffuse plaque in this distal PDA branch but it is too small for PCI.  3. Moderately severe LV systolic dysfunction with segmental hypokinesis of the inferoapical, apical and anteroapical walls c/w Takotsubo's cardiomyopathy.    Recommendations. Admit to telemetry unit. Echo in  am. Will treat with ASA, statin, beta blocker and Ace-inh.   Echo limited12/2017 Study Conclusions   - Left ventricle: The cavity size was normal. Systolic function was    normal. The estimated ejection fraction was in the range of 55%    to 60%. Wall motion was normal; there were no regional wall    motion abnormalities. Left ventricular diastolic function    parameters were normal.  - Right ventricle: Systolic function was normal.  - Pulmonary arteries: Systolic pressure could not be accurately    estimated.   EKG:  EKG is  ordered today.  The ekg ordered today demonstrates NSR, 79bpm, LAD, no St/T wave changes  Recent Labs: 12/30/2020: BUN 15; Creatinine, Ser 0.93; Hemoglobin 14.4; Platelets 262; Potassium 4.3; Sodium 139  Recent Lipid Panel    Component Value Date/Time   CHOL 150 01/07/2016 0609   TRIG 96 01/07/2016 0609   HDL 32 (L) 01/07/2016 0609   CHOLHDL 4.7 01/07/2016 0609   VLDL 19 01/07/2016 0609   LDLCALC 99 01/07/2016 0609     Physical Exam:    VS:  BP (!) 126/100 (BP Location: Left Arm, Patient Position: Sitting, Cuff Size: Large)   Pulse 79   Ht '5\' 9"'$  (1.753 m)   Wt 225 lb  (102.1 kg)   LMP 10/20/2019   SpO2 98%   BMI 33.23 kg/m     Wt Readings from Last 3 Encounters:  01/16/21 225 lb (102.1 kg)  12/30/20 200 lb (90.7 kg)  02/13/20 212 lb (96.2 kg)     GEN:  Well nourished, well developed in no acute distress HEENT: Normal NECK: No JVD; No carotid bruits LYMPHATICS: No lymphadenopathy CARDIAC: RRR, no murmurs, rubs, gallops RESPIRATORY:  Clear to auscultation without rales, wheezing or rhonchi  ABDOMEN: Soft, non-tender, non-distended MUSCULOSKELETAL:  No edema; No deformity  SKIN: Warm and dry NEUROLOGIC:  Alert and oriented x 3 PSYCHIATRIC:  Normal affect   ASSESSMENT:    1. Precordial pain   2. Takotsubo cardiomyopathy   3. DOE (dyspnea on exertion)   4. Essential hypertension   5. Tobacco use   6. Dyspnea on exertion    PLAN:    In order of problems listed above:  Chest pain and DOE Patient reports a couple weeks of DOE followed by chest pain episode that brought her to the ER. In the ER troponin negative, EKG unchanged, labs unremarkable. Since that time she has not had recurrent chest pain, but continues to have DOE. No LLE, orthopnea, pnd. EKG with no new changes. H/o of cath in 2017 shoing minimal nonobstructive CAD. H/o of Takotsubo CM with improvement of EF to 55-60% by echo 04/2016. I will order echo and cardiac CT. I will also check a TSH. Labs from the ER showed normal Hgb. Continue Aspirin, Coreg and statin.   Takotsubo Cardiomyopathy EF down to 25-25% by echo in 2017. EF improved to 55-60% by echo 04/2016. Re-check Echo as above. Appears euvolemic on exam. Continue Coreg and Lisinopril.  HTN BP 126/100. Increase coreg to 6.'25mg'$  BID.  Tobacco use Cessation advised.   Disposition: Follow up in 6 week(s) with MD/APP   Signed, Mell Guia Ninfa Meeker, PA-C  01/16/2021 1:34 PM    [facility]

## 2021-01-16 ENCOUNTER — Other Ambulatory Visit: Payer: Self-pay

## 2021-01-16 ENCOUNTER — Encounter: Payer: Self-pay | Admitting: Medical

## 2021-01-16 ENCOUNTER — Ambulatory Visit: Payer: BC Managed Care – PPO | Admitting: Medical

## 2021-01-16 VITALS — BP 126/100 | HR 79 | Ht 69.0 in | Wt 225.0 lb

## 2021-01-16 DIAGNOSIS — R072 Precordial pain: Secondary | ICD-10-CM

## 2021-01-16 DIAGNOSIS — I10 Essential (primary) hypertension: Secondary | ICD-10-CM | POA: Diagnosis not present

## 2021-01-16 DIAGNOSIS — Z720 Tobacco use: Secondary | ICD-10-CM

## 2021-01-16 DIAGNOSIS — R0609 Other forms of dyspnea: Secondary | ICD-10-CM

## 2021-01-16 DIAGNOSIS — I5181 Takotsubo syndrome: Secondary | ICD-10-CM | POA: Diagnosis not present

## 2021-01-16 DIAGNOSIS — R0600 Dyspnea, unspecified: Secondary | ICD-10-CM | POA: Diagnosis not present

## 2021-01-16 MED ORDER — METOPROLOL TARTRATE 100 MG PO TABS: 100.0000 mg | ORAL_TABLET | Freq: Once | ORAL | 0 refills | Status: DC

## 2021-01-16 MED ORDER — CARVEDILOL 6.25 MG PO TABS: 6.2500 mg | ORAL_TABLET | Freq: Two times a day (BID) | ORAL | 3 refills | Status: DC

## 2021-01-16 NOTE — Patient Instructions (Signed)
Medication Instructions:   Your physician has recommended you make the following change in your medication:   INCREASE Carvedilol 6.25 mg TWICE daily   *If you need a refill on your cardiac medications before your next appointment, please call your pharmacy*   Lab Work:  TSH today  If you have labs (blood work) drawn today and your tests are completely normal, you will receive your results only by: [place] (if you have MyChart) OR A paper copy in the mail If you have any lab test that is abnormal or we need to change your treatment, we will call you to review the results.   Testing/Procedures:  1) Your physician has requested that you have an echocardiogram. Echocardiography is a painless test that uses sound waves to create images of your heart. It provides your doctor with information about the size and shape of your heart and how well your heart's chambers and valves are working. This procedure takes approximately one hour. There are no restrictions for this procedure.  2) You are scheduled for a coronary CTA 01/23/21 at 2:30 PM.  Your cardiac CT is scheduled at the following location:   [facility] [address] [place], [place] [address] [phone]  If scheduled at [facility], please arrive 15 mins early for check-in and test prep.  Please follow these instructions carefully (unless otherwise directed):   On the Night Before the Test: Be sure to Drink plenty of water. Do not consume any caffeinated/decaffeinated beverages or chocolate 12 hours prior to your test. Do not take any antihistamines 12 hours prior to your test.   On the Day of the Test: Drink plenty of water until 1 hour prior to the test. Do not eat any food 4 hours prior to the test. You may take your regular medications prior to the test.  Take metoprolol (Lopressor) two hours prior to test. HOLD  Furosemide/Hydrochlorothiazide morning of the test. FEMALES- please wear underwire-free bra if available, avoid dresses & tight clothing       After the Test: Drink plenty of water. After receiving IV contrast, you may experience a mild flushed feeling. This is normal. On occasion, you may experience a mild rash up to 24 hours after the test. This is not dangerous. If this occurs, you can take Benadryl 25 mg and increase your fluid intake. If you experience trouble breathing, this can be serious. If it is severe call 911 IMMEDIATELY. If it is mild, please call our office. If you take any of these medications: Glipizide/Metformin, Avandament, Glucavance, please do not take 48 hours after completing test unless otherwise instructed.  Please allow 2-4 weeks for scheduling of routine cardiac CTs. Some insurance companies require a pre-authorization which may delay scheduling of this test.   For non-scheduling related questions, please contact the cardiac imaging nurse navigator should you have any questions/concerns: Marchia Bond, Cardiac Imaging Nurse Navigator Gordy Clement, Cardiac Imaging Nurse Navigator [facility] Direct Office Dial: [phone]   For scheduling needs, including cancellations and rescheduling, please call [place], [phone].   Follow-Up: At [facility], you and your health needs are our priority.  As part of our continuing mission to provide you with exceptional heart care, we have created designated Provider Care Teams.  These Care Teams include your primary Cardiologist (physician) and Advanced Practice Providers (APPs -  Physician Assistants and Nurse Practitioners) who all work together to provide you with the care you  need, when you need it.  We recommend signing up for the patient portal called "MyChart".  Sign up information is provided on this After Visit Summary.  MyChart is used to connect with patients for Virtual Visits  (Telemedicine).  Patients are able to view lab/test results, encounter notes, upcoming appointments, etc.  Non-urgent messages can be sent to your provider as well.   To learn more about what you can do with MyChart, go to [email or web address].    Your next appointment:    Follow up after CCTA and Echo  The format for your next appointment:   In Person  Provider:   You may see Nelva Bush, MD or one of the following Advanced Practice Providers on your designated Care Team:   Murray Hodgkins, NP Christell Faith, PA-C Marrianne Mood, PA-C Cadence Bassett, [place]

## 2021-01-17 ENCOUNTER — Ambulatory Visit (INDEPENDENT_AMBULATORY_CARE_PROVIDER_SITE_OTHER): Payer: BC Managed Care – PPO

## 2021-01-17 DIAGNOSIS — R0609 Other forms of dyspnea: Secondary | ICD-10-CM

## 2021-01-17 DIAGNOSIS — R0600 Dyspnea, unspecified: Secondary | ICD-10-CM

## 2021-01-17 DIAGNOSIS — R072 Precordial pain: Secondary | ICD-10-CM | POA: Diagnosis not present

## 2021-01-17 LAB — ECHOCARDIOGRAM COMPLETE
AR max vel: 3.41 cm2
AV Area VTI: 2.97 cm2
AV Area mean vel: 3.47 cm2
AV Mean grad: 3 mmHg
AV Peak grad: 6.4 mmHg
Ao pk vel: 1.26 m/s
Area-P 1/2: 4.39 cm2
Calc EF: 53.8 %
S' Lateral: 3.7 cm
Single Plane A2C EF: 52.1 %
Single Plane A4C EF: 53.9 %

## 2021-01-17 LAB — TSH: TSH: 7.54 u[IU]/mL — ABNORMAL HIGH (ref 0.450–4.500)

## 2021-01-20 ENCOUNTER — Telehealth: Payer: Self-pay

## 2021-01-20 DIAGNOSIS — R7989 Other specified abnormal findings of blood chemistry: Secondary | ICD-10-CM

## 2021-01-20 NOTE — Telephone Encounter (Signed)
Attempted to reach pt via phone, unable to make contact LMTCB for lab results  Thyroid function mildly elevated. Lets check Ft4 and FT3. Will ultimately need to follow-up with PCP for this. -Cadence Kathlen Mody, PA-C  Lab order placed for [facility] T4 T3  Pt needs lab appt made when she calls in to have lab drawn.   No PCP on file, if PCP needed, pt can call [place] [phone] Assistance with finding a primary care provider within your area

## 2021-01-20 NOTE — Telephone Encounter (Signed)
Left detail message on VM of pt's recent results okay by DPR, Dr. Rockey Situ advised   "Echo showed LVEF 6o-65%, no wall motion abnormalities. Improved from prior echo, overall reassuring "  At this time, no further recommendations or medications changes, advised to call office for any concerns or questions, otherwise will see at next visit.   Results released to [place]

## 2021-01-21 ENCOUNTER — Telehealth: Payer: Self-pay

## 2021-01-21 ENCOUNTER — Telehealth (HOSPITAL_COMMUNITY): Payer: Self-pay | Admitting: Emergency Medicine

## 2021-01-21 NOTE — Telephone Encounter (Signed)
Patient returning call. She will be unable to answer phone call due to work but will check my chart at lunch.      Patient scheduled for 9/15 930 labs and is aware nurse will reach out via my chart to discuss below.

## 2021-01-21 NOTE — Telephone Encounter (Signed)
Attempted to call patient regarding upcoming cardiac CT appointment. °Left message on voicemail with name and callback number °Brinlynn Gorton RN Navigator Cardiac Imaging °[facility] °[phone] Office °[phone] Cell ° °

## 2021-01-21 NOTE — Telephone Encounter (Signed)
Patient unable to get mychart tp work.  Please call instead .

## 2021-01-21 NOTE — Telephone Encounter (Signed)
Was able to reach back out to Mrs. Sarah Mills, she was able to get into her MyChart and review her labs. She verbalized understanding.   Has an appt for 9/15 to come to the office to have T3 &T4 drawn. Pt given number for [place] [phone] to help get establish for a PCP to follow her with her elevated TSH levels.   Mrs. Sarah Mills thankful for the return call and results.

## 2021-01-22 ENCOUNTER — Encounter (HOSPITAL_COMMUNITY): Payer: Self-pay

## 2021-01-23 ENCOUNTER — Other Ambulatory Visit (INDEPENDENT_AMBULATORY_CARE_PROVIDER_SITE_OTHER): Payer: BC Managed Care – PPO

## 2021-01-23 ENCOUNTER — Other Ambulatory Visit: Payer: Self-pay

## 2021-01-23 ENCOUNTER — Ambulatory Visit
Admission: RE | Admit: 2021-01-23 | Discharge: 2021-01-23 | Disposition: A | Discharge status: Home / Self Care | Payer: BC Managed Care – PPO | Source: Ambulatory Visit | Attending: Medical | Admitting: Medical

## 2021-01-23 DIAGNOSIS — R7989 Other specified abnormal findings of blood chemistry: Secondary | ICD-10-CM | POA: Diagnosis not present

## 2021-01-23 DIAGNOSIS — R0600 Dyspnea, unspecified: Secondary | ICD-10-CM | POA: Diagnosis present

## 2021-01-23 DIAGNOSIS — R072 Precordial pain: Secondary | ICD-10-CM | POA: Insufficient documentation

## 2021-01-23 DIAGNOSIS — R0609 Other forms of dyspnea: Secondary | ICD-10-CM

## 2021-01-23 HISTORY — DX: Essential (primary) hypertension: I10

## 2021-01-23 MED ORDER — NITROGLYCERIN 0.4 MG SL SUBL
0.8000 mg | SUBLINGUAL_TABLET | Freq: Once | SUBLINGUAL | Status: AC
  Administered 2021-01-23: 0.8 mg via SUBLINGUAL

## 2021-01-23 MED ORDER — IOHEXOL 350 MG/ML SOLN
100.0000 mL | Freq: Once | INTRAVENOUS | Status: AC | PRN
  Administered 2021-01-23: 100 mL via INTRAVENOUS

## 2021-01-23 NOTE — Progress Notes (Signed)
Patient tolerated procedure well. Ambulate w/o difficulty. Denies light headedness or being dizzy. Sitting in chair drinking water provided. Encouraged to drink extra water today and reasoning explained. Verbalized understanding. All questions answered. ABC intact. No further needs. Discharge from procedure area w/o issues.   °

## 2021-01-24 ENCOUNTER — Telehealth: Payer: Self-pay | Admitting: Medical

## 2021-01-24 LAB — T4, FREE: Free T4: 0.93 ng/dL (ref 0.82–1.77)

## 2021-01-24 LAB — T3, FREE: T3, Free: 3 pg/mL (ref 2.0–4.4)

## 2021-01-24 NOTE — Telephone Encounter (Signed)
Called to give the patient results. Lmtcb.  Cadence Ninfa Meeker, PA-C  01/24/2021  7:57 AM EDT Back to Top    Subsequent thyroid labs normal, can follow-up with PCP  Message Received: Today Furth, Cadence H, PA-C  P Cv Div [place] Triage Cardiac CT showed calcium score of 0, she is at low risk for coronary events. No evidence of CAD.

## 2021-01-27 NOTE — Telephone Encounter (Signed)
Spoke with pt. Notified of CCTA and lab results below with provider's recc.  Pt voiced understanding and has no questions at this time.

## 2021-02-09 NOTE — Progress Notes (Signed)
Cardiology Office Note:    Date:  02/10/2021   ID:  Sarah Mills, DOB 27-Feb-1976, MRN [id number]  PCP:  [place] Cardiologist:  Nelva Bush, MD  [place] Electrophysiologist:  None   Referring MD: Antony Madura, PA-C   Chief Complaint: F/u echo/CT  History of Present Illness:    Sarah Mills is a 45 y.o. female with a hx of Takotsubo cardiomyopathy, IBS, menorrhagia, minimal nonobstructive CAD, family history of CAD, tobacco use who presents for hospital follow-up.   Cardiac history dates back to August 2017, when she presented to [facility] for chest pressure and shortness of breath.  She was admitted for non-STEMI and underwent cath showing minimal plaque in the RPDA otherwise normal coronaries.  EF was 25 to 35% by ventriculography in 4045% by echo.  She was placed on aspirin, statin, beta-blocker, ACE. Follow-up echo December 2017 showed EF 55 to 60% without wall motion abnormalities.   Patient was seen 02/13/2020 for headache in the setting of elevated blood pressure.  Recommended she take extra Coreg or lisinopril.  Patient had an ED visit for chest pain 12/2020. Work-up was normal. In follow-up a cardiac CT and echo were ordered.   Cardiac CT 01/23/21 showed no CAD. Echo showed LVEF 60-65%  Today, the Cardiac CT and echo were reviewed in detail. She reports no further chest pain. She has left leg pain and left foot and is going to see PCP for it later this month.  Possible plantar fascitis. Says her breathing is better since the last visit. No LLE, orthopnea, pnd. She elevates her feet at night.   Past Medical History:  Diagnosis Date   Dysmenorrhea    Headache    Hypertension    IBS (irritable bowel syndrome)    Menorrhagia    NSTEMI (non-ST elevated myocardial infarction) ([place])    a. 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   Obesity    Takotsubo cardiomyopathy    a. 12/2015 Cath: Nl cors - ?  plaque in small RPDA.  EF 25-35%; b. 12/2015 Echo: EF 40-45%; c. 04/2016 Echo: EF 55-60%, no rwma. Nl RV fxn.   Tobacco abuse     Past Surgical History:  Procedure Laterality Date   CARDIAC CATHETERIZATION N/A 01/06/2016   Procedure: Left Heart Cath and Coronary Angiography;  Surgeon: Burnell Blanks, MD;  Location: [facility];  Service: Cardiovascular;  Laterality: N/A;   CYSTOSCOPY N/A 11/16/2019   Procedure: CYSTOSCOPY;  Surgeon: Will Bonnet, MD;  Location: [facility];  Service: Gynecology;  Laterality: N/A;   TOTAL LAPAROSCOPIC HYSTERECTOMY WITH SALPINGECTOMY Bilateral 11/16/2019   Procedure: TOTAL LAPAROSCOPIC HYSTERECTOMY WITH BILATERIAL SALPINGECTOMY;  Surgeon: Will Bonnet, MD;  Location: [facility];  Service: Gynecology;  Laterality: Bilateral;   TUBAL LIGATION      Current Medications: Current Meds  Medication Sig   acetaminophen (TYLENOL) 500 MG tablet Take 1,000 mg by mouth every 6 (six) hours as needed for moderate pain.    aspirin EC 81 MG tablet Take 1 tablet (81 mg total) by mouth daily.   atorvastatin (LIPITOR) 40 MG tablet Take 1 tablet (40 mg total) by mouth daily. Please call to schedule office visit for further refills. Thank you!   carvedilol (COREG) 6.25 MG tablet Take 1 tablet (6.25 mg total) by mouth 2 (two) times daily with a meal.   lisinopril (ZESTRIL) 5 MG tablet TAKE 1 TABLET BY MOUTH DAILY AS NEEDED FOR  DIASTOLIC BLOOD PRESSURE( BOTTOM NUMBER) GREATER THAN 90   nitroGLYCERIN (NITROSTAT) 0.4 MG SL tablet Place 1 tablet (0.4 mg total) under the tongue every 5 (five) minutes as needed for chest pain.     Allergies:   Patient has no known allergies.   Social History   Socioeconomic History   Marital status: Married    Spouse name: Not on file   Number of children: Not on file   Years of education: Not on file   Highest education level: Not on file  Occupational History   Not on file  Tobacco Use   Smoking status: Some Days     Packs/day: 0.25    Years: 28.00    Pack years: 7.00    Types: Cigarettes   Smokeless tobacco: Never   Tobacco comments:    a pack lastes 4 days, pt trying to quit  Vaping Use   Vaping Use: Never used  Substance and Sexual Activity   Alcohol use: No   Drug use: No   Sexual activity: Not Currently    Birth control/protection: None  Other Topics Concern   Not on file  Social History Narrative   Lives in [place] w/ son, dtr, and grandchild.  Works @ a [employer].  Active @ work and goes for walks with her granddtr regularly.   Social Determinants of Health   Financial Resource Strain: Not on file  Food Insecurity: Not on file  Transportation Needs: Not on file  Physical Activity: Not on file  Stress: Not on file  Social Connections: Not on file     Family History: The patient's family history includes CAD in her father and maternal grandfather; Heart attack (age of onset: 57) in her father; Heart disease in her father.  ROS:   Please see the history of present illness.     All other systems reviewed and are negative.  EKGs/Labs/Other Studies Reviewed:    The following studies were reviewed today:  Echo 01/2021 1. Left ventricular ejection fraction, by estimation, is 60 to 65%. The  left ventricle has normal function. The left ventricle has no regional  wall motion abnormalities. Left ventricular diastolic parameters were  normal. The average left ventricular  global longitudinal strain is -16.3 %. The global longitudinal strain is  normal.   2. Right ventricular systolic function is normal. The right ventricular  size is normal. Tricuspid regurgitation signal is inadequate for assessing  PA pressure.   3. The aortic valve was not well visualized. Aortic valve regurgitation  is not visualized. No aortic stenosis is present.   Cardiac CT 01/2021 IMPRESSION: 1. Normal coronary calcium score of 0. Patient is low risk for coronary events.   2. Normal coronary origin with  right dominance.   3. No evidence of CAD.   4. CAD-RADS 0. Consider non-atherosclerotic causes of chest pain.   Electronically Signed: By: Kate Sable M.D. On: 01/23/2021 14:13  EKG:  EKG is not ordered today.    Recent Labs: 12/30/2020: BUN 15; Creatinine, Ser 0.93; Hemoglobin 14.4; Platelets 262; Potassium 4.3; Sodium 139 01/16/2021: TSH 7.540  Recent Lipid Panel    Component Value Date/Time   CHOL 150 01/07/2016 0609   TRIG 96 01/07/2016 0609   HDL 32 (L) 01/07/2016 0609   CHOLHDL 4.7 01/07/2016 0609   VLDL 19 01/07/2016 0609   LDLCALC 99 01/07/2016 0609   Physical Exam:    VS:  BP 112/88 (BP Location: Left Arm, Patient Position: Sitting, Cuff Size: Normal)  Pulse 80   Ht 5\' 9"  (1.753 m)   Wt 229 lb (103.9 kg)   LMP 10/20/2019   SpO2 97%   BMI 33.82 kg/m     Wt Readings from Last 3 Encounters:  02/10/21 229 lb (103.9 kg)  01/16/21 225 lb (102.1 kg)  12/30/20 200 lb (90.7 kg)     GEN:  Well nourished, well developed in no acute distress HEENT: Normal NECK: No JVD; No carotid bruits LYMPHATICS: No lymphadenopathy CARDIAC: RRR, no murmurs, rubs, gallops RESPIRATORY:  Clear to auscultation without rales, wheezing or rhonchi  ABDOMEN: Soft, non-tender, non-distended MUSCULOSKELETAL:  No edema; No deformity  SKIN: Warm and dry NEUROLOGIC:  Alert and oriented x 3 PSYCHIATRIC:  Normal affect   ASSESSMENT:    1. Essential hypertension   2. Tobacco use   3. Takotsubo cardiomyopathy   4. Dyspnea on exertion   5. Precordial pain    PLAN:    In order of problems listed above:  Chest pain Cardiac CT showed calcium score of 0 and no CAD. Echo showed normal pump function and normal WMA. She denies further chest pain episodes. Reports her breathing is better. She has suspected plantar fascitis limiting her activity level, she will see PCP later this month. Continue Aspirin, statin, BB.  Takotsubo CM EF down to 25-25% by echo in 2017. EF improved to 55-60%  by echo 04/2016. Repeat echo showed LVEF 60-65%, no WMA, normal diastolic dysfunction.  HTN BP good today. Continue Coreg, lisinopril, and Lopressor.  Tobacco use Still smoking 1pack will last a week. Complete cessation encouraged  Disposition: Follow up in 6 month(s) with MD/APP   Signed, Sandip Power Ninfa Meeker, PA-C  02/10/2021 2:32 PM    [facility]

## 2021-02-10 ENCOUNTER — Ambulatory Visit: Payer: BC Managed Care – PPO | Admitting: Medical

## 2021-02-10 ENCOUNTER — Other Ambulatory Visit: Payer: Self-pay

## 2021-02-10 ENCOUNTER — Encounter: Payer: Self-pay | Admitting: Medical

## 2021-02-10 VITALS — BP 112/88 | HR 80 | Ht 69.0 in | Wt 229.0 lb

## 2021-02-10 DIAGNOSIS — Z720 Tobacco use: Secondary | ICD-10-CM

## 2021-02-10 DIAGNOSIS — R0609 Other forms of dyspnea: Secondary | ICD-10-CM

## 2021-02-10 DIAGNOSIS — I10 Essential (primary) hypertension: Secondary | ICD-10-CM

## 2021-02-10 DIAGNOSIS — I5181 Takotsubo syndrome: Secondary | ICD-10-CM

## 2021-02-10 DIAGNOSIS — R072 Precordial pain: Secondary | ICD-10-CM

## 2021-02-10 NOTE — Patient Instructions (Signed)
Medication Instructions:  Your physician recommends that you continue on your current medications as directed. Please refer to the Current Medication list given to you today.  *If you need a refill on your cardiac medications before your next appointment, please call your pharmacy*   Lab Work: None ordered If you have labs (blood work) drawn today and your tests are completely normal, you will receive your results only by: [place] (if you have MyChart) OR A paper copy in the mail If you have any lab test that is abnormal or we need to change your treatment, we will call you to review the results.   Testing/Procedures: None ordered   Follow-Up: At [facility], you and your health needs are our priority.  As part of our continuing mission to provide you with exceptional heart care, we have created designated Provider Care Teams.  These Care Teams include your primary Cardiologist (physician) and Advanced Practice Providers (APPs -  Physician Assistants and Nurse Practitioners) who all work together to provide you with the care you need, when you need it.  We recommend signing up for the patient portal called "MyChart".  Sign up information is provided on this After Visit Summary.  MyChart is used to connect with patients for Virtual Visits (Telemedicine).  Patients are able to view lab/test results, encounter notes, upcoming appointments, etc.  Non-urgent messages can be sent to your provider as well.   To learn more about what you can do with MyChart, go to [email or web address].    Your next appointment:   Your physician wants you to follow-up in: 6 months  You will receive a reminder letter in the mail two months in advance. If you don't receive a letter, please call our office to schedule the follow-up appointment.   The format for your next appointment:   In Person  Provider:   You may see Nelva Bush, MD or one of the following Advanced Practice Providers on your  designated Care Team:   Murray Hodgkins, NP Christell Faith, PA-C Marrianne Mood, PA-C Cadence Kathlen Mody, [place]   Other Instructions N/A

## 2021-02-12 DIAGNOSIS — I10 Essential (primary) hypertension: Secondary | ICD-10-CM | POA: Insufficient documentation

## 2021-02-12 DIAGNOSIS — Z8679 Personal history of other diseases of the circulatory system: Secondary | ICD-10-CM | POA: Insufficient documentation

## 2021-02-13 ENCOUNTER — Other Ambulatory Visit: Payer: Self-pay | Admitting: Infectious Diseases

## 2021-02-13 DIAGNOSIS — Z1231 Encounter for screening mammogram for malignant neoplasm of breast: Secondary | ICD-10-CM

## 2021-02-25 DIAGNOSIS — S63502A Unspecified sprain of left wrist, initial encounter: Secondary | ICD-10-CM | POA: Insufficient documentation

## 2021-02-25 DIAGNOSIS — M25432 Effusion, left wrist: Secondary | ICD-10-CM | POA: Insufficient documentation

## 2021-02-25 DIAGNOSIS — M79632 Pain in left forearm: Secondary | ICD-10-CM | POA: Insufficient documentation

## 2021-03-08 ENCOUNTER — Other Ambulatory Visit: Payer: Self-pay | Admitting: Medical

## 2021-03-13 LAB — COLOGUARD: COLOGUARD: NEGATIVE

## 2021-06-13 IMAGING — US US PELVIS COMPLETE WITH TRANSVAGINAL
1 series · 14 of 25 positions shown · non-contrast
Comparison: None

CLINICAL DATA: Vaginal bleeding

EXAM:
TRANSABDOMINAL AND TRANSVAGINAL ULTRASOUND OF PELVIS
TECHNIQUE: Study was performed transabdominally to optimize pelvic field of
view evaluation and transvaginally to optimize internal visceral
architecture evaluation.

[Series 1: us pelvic complete with transvaginal · 14 of 86 slices shown]
[im 1/86]
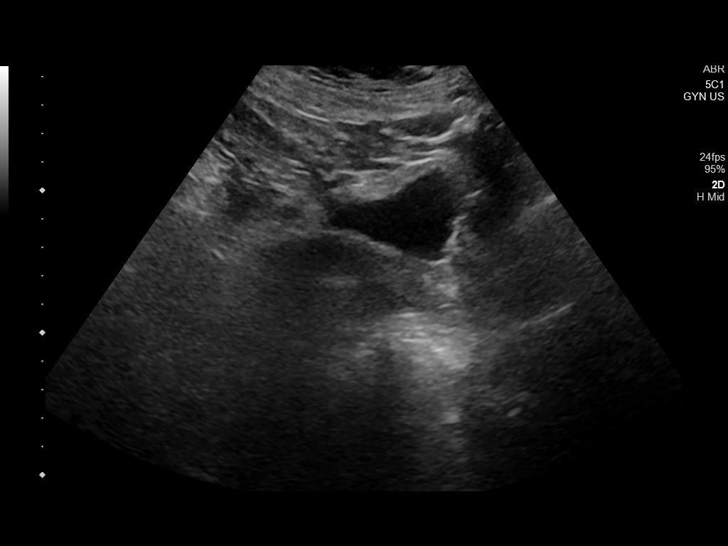
[im 8/86]
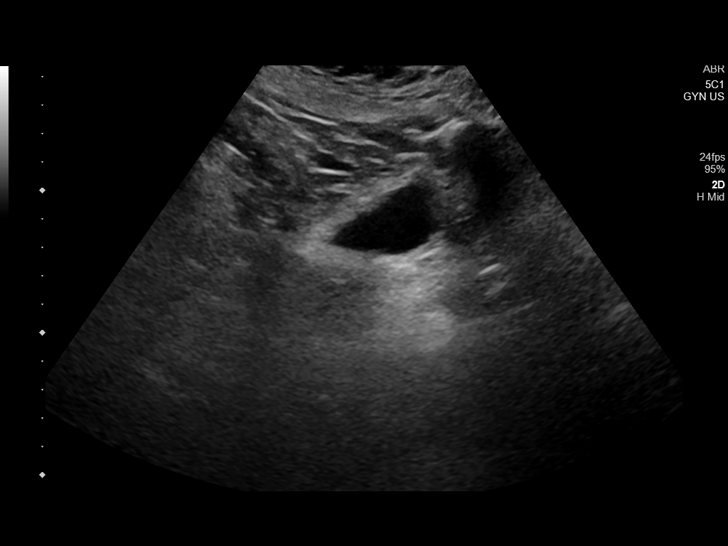
[im 15/86]
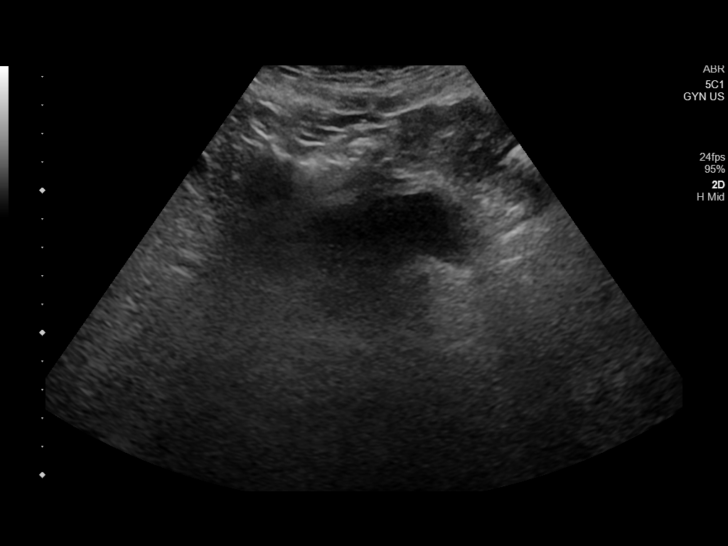
[im 22/86]
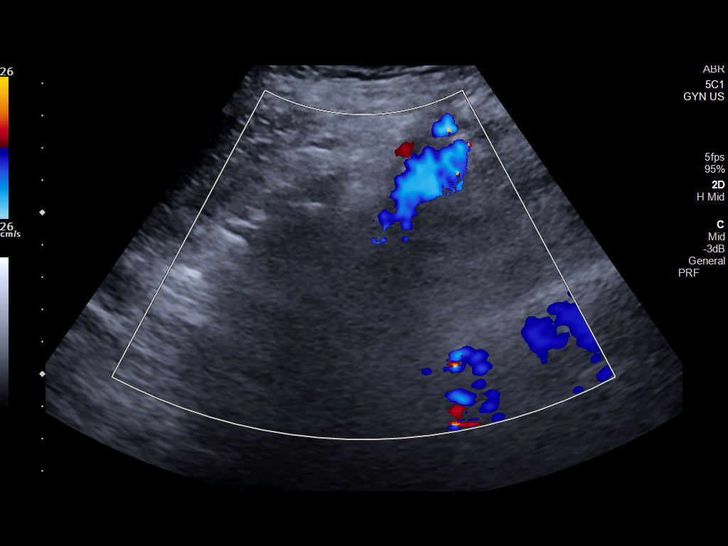
[im 29/86]
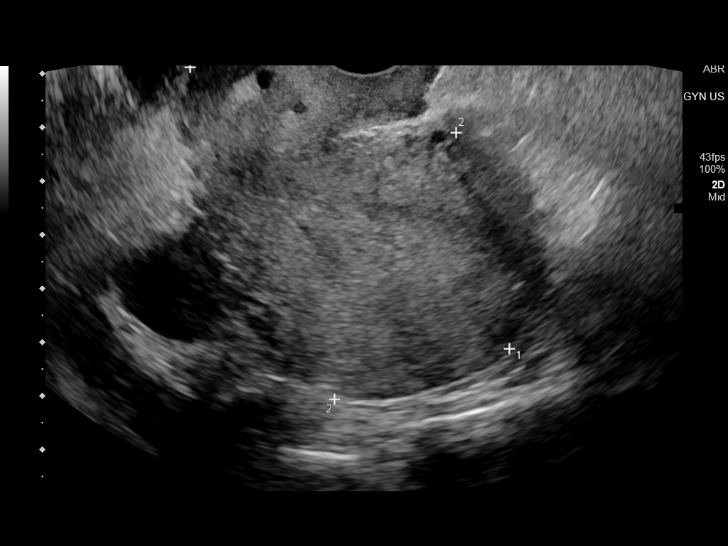
[im 32/86]
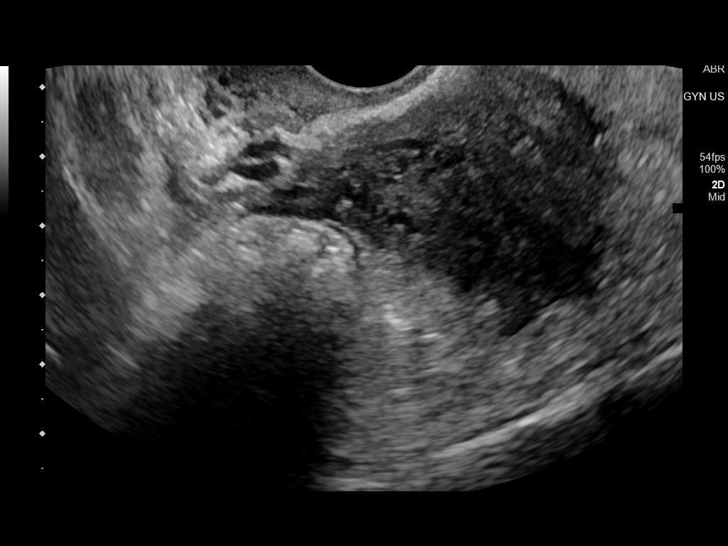
[im 39/86]
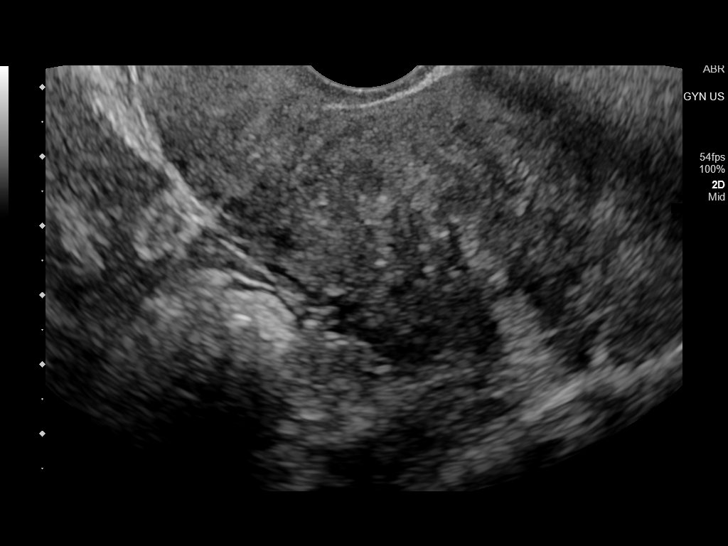
[im 47/86]
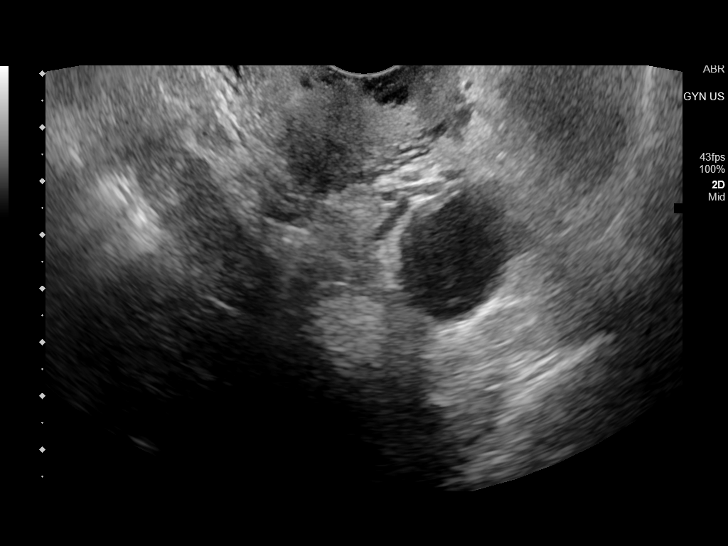
[im 54/86]
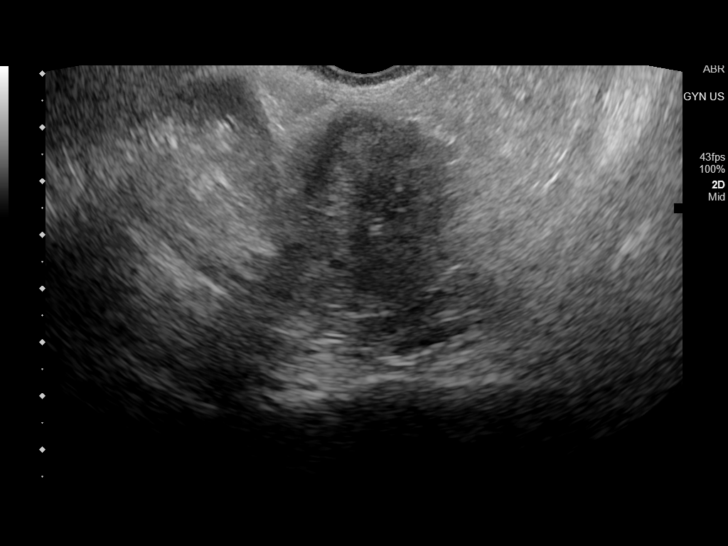
[im 57/86]
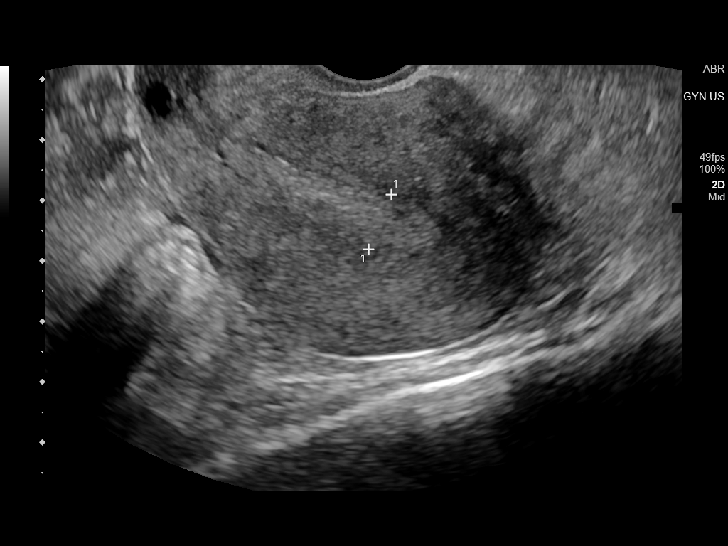
[im 64/86]
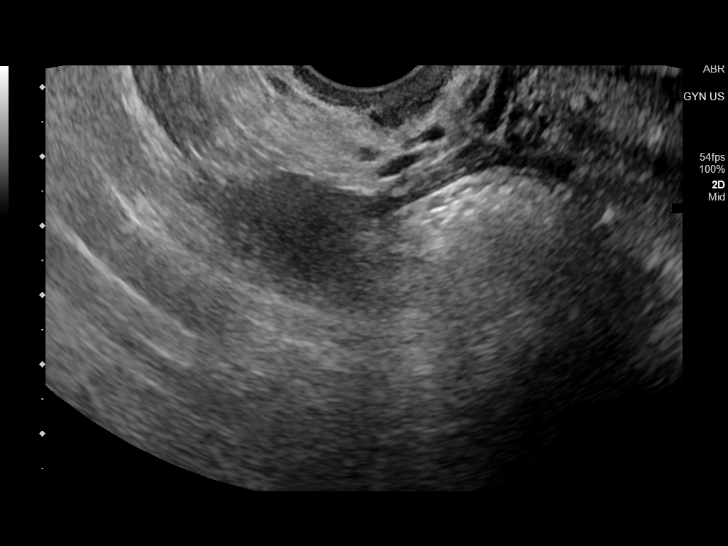
[im 71/86]
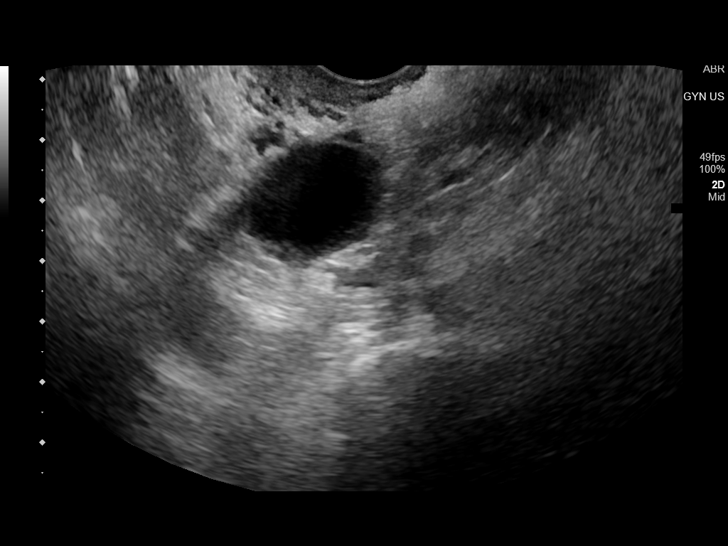
[im 78/86]
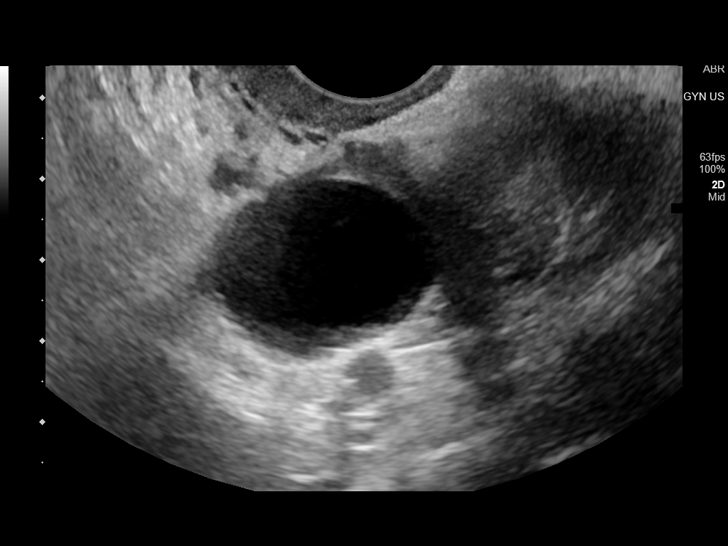
[im 86/86]
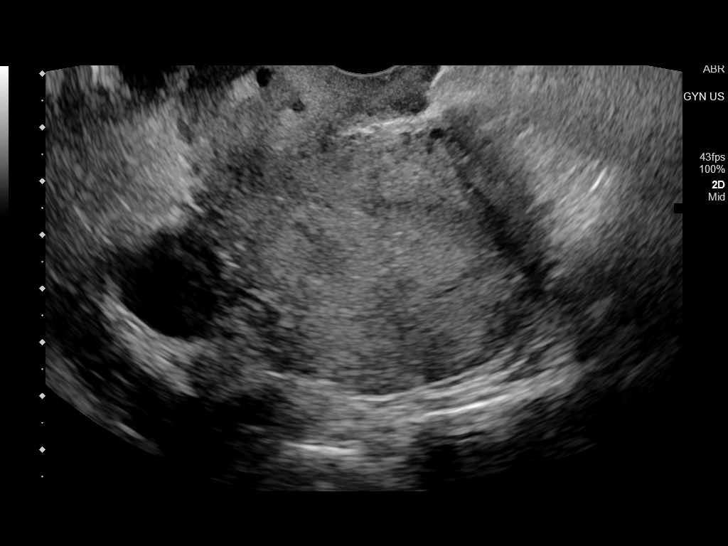

[14 of 25 positions shown; findings below may reference images not displayed]

FINDINGS: Uterus

Measurements: 7.9 x 5.5 x 5.2 cm = volume: 117.1 mL. No intrauterine
mass or inhomogeneity of echotexture noted.

Endometrium

Thickness: 10 mm.  No focal abnormality visualized.

Right ovary

Measurements: 2.4 x 1.7 x 2.3 cm = volume: 4.7 mL. Normal
appearance/no adnexal mass.

Left ovary

Measurements: 3.0 x 2.4 x 2.3 cm = volume: 8.5 mL. There is a 2.3 x
2.3 x 2.1 cm cyst in the left ovary, a likely dominant follicle. No
other extrauterine pelvic mass.

Other findings

No abnormal free fluid.
IMPRESSION: Probable dominant follicle left ovary measuring 2.3 x 2.3 x 2.1 cm.
No other extrauterine pelvic mass. Uterus and endometrium appear
normal.

## 2021-10-10 ENCOUNTER — Encounter: Payer: Self-pay | Admitting: Medical

## 2021-10-10 ENCOUNTER — Ambulatory Visit: Payer: BC Managed Care – PPO | Admitting: Medical

## 2021-10-10 NOTE — Progress Notes (Deleted)
Cardiology Office Note:    Date:  10/10/2021   ID:  Sarah Mills, DOB 02-28-1976, MRN [id number]  PCP:  [facility] Cardiologist:  Nelva Bush, MD  [place] Electrophysiologist:  None   Referring MD: [place]   Chief Complaint: 6 month follow-up  History of Present Illness:    Sarah Mills is a 46 y.o. female with a hx of Takotsubo cardiomyopathy, IBS, menorrhagia, minimal nonobstructive CAD, family history of CAD, tobacco use who presents for hospital follow-up.   Cardiac history dates back to August 2017, when she presented to [facility] for chest pressure and shortness of breath.  She was admitted for non-STEMI and underwent cath showing minimal plaque in the RPDA otherwise normal coronaries.  EF was 25 to 35% by ventriculography in 4045% by echo.  She was placed on aspirin, statin, beta-blocker, ACE. Follow-up echo December 2017 showed EF 55 to 60% without wall motion abnormalities.   Patient was seen 02/13/2020 for headache in the setting of elevated blood pressure.  Recommended she take extra Coreg or lisinopril.   Patient had an ED visit for chest pain 12/2020. Work-up was normal. In follow-up a cardiac CT and echo were ordered. Cardiac CTA 01/23/21 showed no CAD. Echo showed LVEF 60-65%.  Last seen 02/2021 and denied further chest pain.   Today,   Past Medical History:  Diagnosis Date   Dysmenorrhea    Headache    Hypertension    IBS (irritable bowel syndrome)    Menorrhagia    NSTEMI (non-ST elevated myocardial infarction) ([place])    a. 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   Obesity    Takotsubo cardiomyopathy    a. 12/2015 Cath: Nl cors - ? plaque in small RPDA.  EF 25-35%; b. 12/2015 Echo: EF 40-45%; c. 04/2016 Echo: EF 55-60%, no rwma. Nl RV fxn.   Tobacco abuse     Past Surgical History:  Procedure Laterality Date   CARDIAC CATHETERIZATION N/A 01/06/2016   Procedure: Left Heart  Cath and Coronary Angiography;  Surgeon: Burnell Blanks, MD;  Location: [place] [facility];  Service: Cardiovascular;  Laterality: N/A;   CYSTOSCOPY N/A 11/16/2019   Procedure: CYSTOSCOPY;  Surgeon: Will Bonnet, MD;  Location: [facility];  Service: Gynecology;  Laterality: N/A;   TOTAL LAPAROSCOPIC HYSTERECTOMY WITH SALPINGECTOMY Bilateral 11/16/2019   Procedure: TOTAL LAPAROSCOPIC HYSTERECTOMY WITH BILATERIAL SALPINGECTOMY;  Surgeon: Will Bonnet, MD;  Location: [facility];  Service: Gynecology;  Laterality: Bilateral;   TUBAL LIGATION      Current Medications: No outpatient medications have been marked as taking for the 10/10/21 encounter (Appointment) with Kathlen Mody, Jimi Schappert H, PA-C.     Allergies:   Patient has no known allergies.   Social History   Socioeconomic History   Marital status: Married    Spouse name: Not on file   Number of children: Not on file   Years of education: Not on file   Highest education level: Not on file  Occupational History   Not on file  Tobacco Use   Smoking status: Some Days    Packs/day: 0.25    Years: 28.00    Pack years: 7.00    Types: Cigarettes   Smokeless tobacco: Never   Tobacco comments:    a pack lastes 4 days, pt trying to quit  Vaping Use   Vaping Use: Never used  Substance and Sexual Activity   Alcohol use: No   Drug  use: No   Sexual activity: Not Currently    Birth control/protection: None  Other Topics Concern   Not on file  Social History Narrative   Lives in [place] w/ son, dtr, and grandchild.  Works @ a [employer].  Active @ work and goes for walks with her granddtr regularly.   Social Determinants of Health   Financial Resource Strain: Not on file  Food Insecurity: Not on file  Transportation Needs: Not on file  Physical Activity: Not on file  Stress: Not on file  Social Connections: Not on file     Family History: The patient's family history includes CAD in her father and maternal grandfather; Heart  attack (age of onset: 88) in her father; Heart disease in her father.  ROS:   Please see the history of present illness.     All other systems reviewed and are negative.  EKGs/Labs/Other Studies Reviewed:    The following studies were reviewed today:   Echo 01/2021 1. Left ventricular ejection fraction, by estimation, is 60 to 65%. The  left ventricle has normal function. The left ventricle has no regional  wall motion abnormalities. Left ventricular diastolic parameters were  normal. The average left ventricular  global longitudinal strain is -16.3 %. The global longitudinal strain is  normal.   2. Right ventricular systolic function is normal. The right ventricular  size is normal. Tricuspid regurgitation signal is inadequate for assessing  PA pressure.   3. The aortic valve was not well visualized. Aortic valve regurgitation  is not visualized. No aortic stenosis is present.    Cardiac CT 01/2021 IMPRESSION: 1. Normal coronary calcium score of 0. Patient is low risk for coronary events.   2. Normal coronary origin with right dominance.   3. No evidence of CAD.   4. CAD-RADS 0. Consider non-atherosclerotic causes of chest pain.   Electronically Signed: By: Kate Sable M.D. On: 01/23/2021 14:13   EKG:  EKG is *** ordered today.  The ekg ordered today demonstrates ***  Recent Labs: 12/30/2020: BUN 15; Creatinine, Ser 0.93; Hemoglobin 14.4; Platelets 262; Potassium 4.3; Sodium 139 01/16/2021: TSH 7.540  Recent Lipid Panel    Component Value Date/Time   CHOL 150 01/07/2016 0609   TRIG 96 01/07/2016 0609   HDL 32 (L) 01/07/2016 0609   CHOLHDL 4.7 01/07/2016 0609   VLDL 19 01/07/2016 0609   LDLCALC 99 01/07/2016 0609     Risk Assessment/Calculations:   {Does this patient have ATRIAL FIBRILLATION?:[id number]}   Physical Exam:    VS:  LMP 10/20/2019     Wt Readings from Last 3 Encounters:  02/10/21 229 lb (103.9 kg)  01/16/21 225 lb (102.1 kg)  12/30/20  200 lb (90.7 kg)     GEN: *** Well nourished, well developed in no acute distress HEENT: Normal NECK: No JVD; No carotid bruits LYMPHATICS: No lymphadenopathy CARDIAC: ***RRR, no murmurs, rubs, gallops RESPIRATORY:  Clear to auscultation without rales, wheezing or rhonchi  ABDOMEN: Soft, non-tender, non-distended MUSCULOSKELETAL:  No edema; No deformity  SKIN: Warm and dry NEUROLOGIC:  Alert and oriented x 3 PSYCHIATRIC:  Normal affect   ASSESSMENT:    No diagnosis found. PLAN:    In order of problems listed above:  Chest pain   Takotsubo CM  HTN  Tobacco use  Disposition: Follow up {follow up:15908} with ***   Shared Decision Making/Informed Consent   {Are you ordering a CV Procedure (e.g. stress test, cath, DCCV, TEE, etc)?   Press F2        :  [id number]}    Signed, Amarra Sawyer Ninfa Meeker, PA-C  10/10/2021 9:49 AM    [facility]

## 2021-10-21 ENCOUNTER — Other Ambulatory Visit: Payer: Self-pay | Admitting: Infectious Diseases

## 2021-10-21 ENCOUNTER — Ambulatory Visit
Admission: RE | Admit: 2021-10-21 | Discharge: 2021-10-21 | Disposition: A | Discharge status: Home / Self Care | Payer: BC Managed Care – PPO | Source: Ambulatory Visit | Attending: Infectious Diseases | Admitting: Infectious Diseases

## 2021-10-21 DIAGNOSIS — M79662 Pain in left lower leg: Secondary | ICD-10-CM | POA: Insufficient documentation

## 2021-10-21 DIAGNOSIS — M7989 Other specified soft tissue disorders: Secondary | ICD-10-CM | POA: Diagnosis present

## 2021-10-23 NOTE — Progress Notes (Signed)
Cardiology Office Note    Date:  10/24/2021   ID:  DEANNIE RESETAR, DOB May 14, 1975, MRN [id number]  PCP:  [place]  Cardiologist:  Nelva Bush, MD  Electrophysiologist:  None   Chief Complaint: Follow-up  History of Present Illness:   CYNTHA BRICKMAN is a 46 y.o. female with history of minimal nonobstructive CAD, Takotsubo cardiomyopathy, menorrhagia with dysmenorrhea, and tobacco use who presents for follow-up of her cardiomyopathy.  Her cardiac history dates back to 12/2015, at which time she was admitted following sudden onset of chest pressure associated with dyspnea and diaphoresis.  Initial troponin was elevated and EKG was notable for new T wave inversion in leads V3 through V5.  She was admitted for management of NSTEMI with diagnostic LHC revealing minimal plaque in the RPDA with otherwise normal coronary arteries.  EF was 25 to 35% by LV gram and 40 to 45% by echo.  Medical therapy was initiated.  Follow-up echo in 04/2016 demonstrated an improvement in her LV systolic function with an EF of 55 to 60% without regional wall motion abnormalities.  Financial constraints have previously limited medical therapy briefly.  She was seen in the ED in 12/2020 with chest pain with negative D-dimer and high sensitive troponin negative x2.  Coronary CTA in 01/2021 demonstrated a calcium score of 0 with no evidence of CAD.  Echo in 01/2021 showed an EF of 60 to 65%, no regional wall motion abnormalities, normal LV diastolic function parameters, normal RV systolic function and ventricular cavity size, and no significant valvular abnormalities.  She was last seen in the office in 02/2021 and was without symptoms of angina or decompensation.  She was evaluated by her PCP earlier this week with left lower extremity pain and swelling that started in the ankle and went up into the thigh.  She denied chest pain or dyspnea.  She indicated the pain felt different than her prior plantar  fasciitis.  Lower extremity ultrasound was negative for DVT.  She comes in doing well from a cardiac perspective and is without symptoms of angina or decompensation.  No significant dyspnea, palpitations, dizziness, presyncope, or syncope.  She has been out of atorvastatin and lisinopril for several weeks.  She is otherwise tolerating aspirin and carvedilol.  She continues to taper tobacco use.   Labs independently reviewed: 10/2021 - Hgb 14.3, PLT 220, TSH 5.619, potassium 4.1, BUN 11, serum creatinine 0.8, albumin 4.0, AST/ALT normal 02/2021 - TC 145, TG 108, HDL 45, LDL 78, A1c 5.6  Past Medical History:  Diagnosis Date   Dysmenorrhea    Headache    Hypertension    IBS (irritable bowel syndrome)    Menorrhagia    NSTEMI (non-ST elevated myocardial infarction) ([place])    a. 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   Obesity    Takotsubo cardiomyopathy    a. 12/2015 Cath: Nl cors - ? plaque in small RPDA.  EF 25-35%; b. 12/2015 Echo: EF 40-45%; c. 04/2016 Echo: EF 55-60%, no rwma. Nl RV fxn.   Tobacco abuse     Past Surgical History:  Procedure Laterality Date   CARDIAC CATHETERIZATION N/A 01/06/2016   Procedure: Left Heart Cath and Coronary Angiography;  Surgeon: Burnell Blanks, MD;  Location: [facility];  Service: Cardiovascular;  Laterality: N/A;   CYSTOSCOPY N/A 11/16/2019   Procedure: CYSTOSCOPY;  Surgeon: Will Bonnet, MD;  Location: [facility];  Service: Gynecology;  Laterality: N/A;   TOTAL LAPAROSCOPIC  HYSTERECTOMY WITH SALPINGECTOMY Bilateral 11/16/2019   Procedure: TOTAL LAPAROSCOPIC HYSTERECTOMY WITH BILATERIAL SALPINGECTOMY;  Surgeon: Will Bonnet, MD;  Location: [facility];  Service: Gynecology;  Laterality: Bilateral;   TUBAL LIGATION      Current Medications: Current Meds  Medication Sig   acetaminophen (TYLENOL) 500 MG tablet Take 1,000 mg by mouth every 6 (six) hours as needed for moderate pain.    aspirin EC 81 MG tablet  Take 1 tablet (81 mg total) by mouth daily.   carvedilol (COREG) 6.25 MG tablet TAKE 1 TABLET(6.25 MG) BY MOUTH TWICE DAILY WITH A MEAL   cyclobenzaprine (FLEXERIL) 5 MG tablet Take 5 mg by mouth 3 (three) times daily as needed for muscle spasms.   levothyroxine (SYNTHROID) 50 MCG tablet Take 50 mcg by mouth daily.   lisinopril (ZESTRIL) 10 MG tablet Take 1 tablet (10 mg total) by mouth daily.   nitroGLYCERIN (NITROSTAT) 0.4 MG SL tablet Place 1 tablet (0.4 mg total) under the tongue every 5 (five) minutes as needed for chest pain.    Allergies:   Patient has no known allergies.   Social History   Socioeconomic History   Marital status: Married    Spouse name: Not on file   Number of children: Not on file   Years of education: Not on file   Highest education level: Not on file  Occupational History   Not on file  Tobacco Use   Smoking status: Some Days    Packs/day: 0.25    Years: 28.00    Total pack years: 7.00    Types: Cigarettes   Smokeless tobacco: Never   Tobacco comments:    a pack lastes 4 days, pt trying to quit  Vaping Use   Vaping Use: Never used  Substance and Sexual Activity   Alcohol use: No   Drug use: No   Sexual activity: Not Currently    Birth control/protection: None  Other Topics Concern   Not on file  Social History Narrative   Lives in [place] w/ son, dtr, and grandchild.  Works @ a [employer].  Active @ work and goes for walks with her granddtr regularly.   Social Determinants of Health   Financial Resource Strain: Not on file  Food Insecurity: Not on file  Transportation Needs: Not on file  Physical Activity: Not on file  Stress: Not on file  Social Connections: Not on file     Family History:  The patient's family history includes CAD in her father and maternal grandfather; Heart attack (age of onset: 57) in her father; Heart disease in her father.  ROS:   12-point review of systems is negative unless otherwise noted in the  HPI.   EKGs/Labs/Other Studies Reviewed:    Studies reviewed were summarized above. The additional studies were reviewed today:  Coronary CTA 01/23/2021: FINDINGS: Aorta:  Normal size.  No calcifications.  No dissection.   Aortic Valve:  Trileaflet.  No calcifications.   Coronary Arteries:  Normal coronary origin.  Right dominance.   RCA is a dominant artery that gives rise to PDA and PLA. There is no plaque.   Left main is a large artery that gives rise to LAD and LCX arteries. LM has no disease.   LAD is a large vessel that has no plaque.   LCX is a non-dominant artery that gives rise to three obtuse marginal branches. There is no plaque.   Other findings:   Normal pulmonary vein drainage into the left atrium.  Normal left atrial appendage without a thrombus.   Normal size of the pulmonary artery.   IMPRESSION: 1. Normal coronary calcium score of 0. Patient is low risk for coronary events. 2. Normal coronary origin with right dominance. 3. No evidence of CAD. 4. CAD-RADS 0. Consider non-atherosclerotic causes of chest pain. __________  2D echo 01/17/2021: 1. Left ventricular ejection fraction, by estimation, is 60 to 65%. The  left ventricle has normal function. The left ventricle has no regional  wall motion abnormalities. Left ventricular diastolic parameters were  normal. The average left ventricular  global longitudinal strain is -16.3 %. The global longitudinal strain is  normal.   2. Right ventricular systolic function is normal. The right ventricular  size is normal. Tricuspid regurgitation signal is inadequate for assessing  PA pressure.   3. The aortic valve was not well visualized. Aortic valve regurgitation  is not visualized. No aortic stenosis is present. __________  Limited echo 04/10/2016: - Left ventricle: The cavity size was normal. Systolic function was    normal. The estimated ejection fraction was in the range of 55%    to 60%. Wall motion  was normal; there were no regional wall    motion abnormalities. Left ventricular diastolic function    parameters were normal.  - Right ventricle: Systolic function was normal.  - Pulmonary arteries: Systolic pressure could not be accurately    estimated. __________  2D echo 01/08/2016: - Left ventricle: The cavity size was normal. Wall thickness was    normal. Systolic function was mildly to moderately reduced. The    estimated ejection fraction was in the range of 40% to 45%.   Impressions:   - Mild - moderate LV dysfunction       There is still mild hypokinesis of the apical walls but the    contractility has improved since the LV gram performed several    day ago. __________  [facility] 01/06/2016: RPDA lesion, 30 %stenosed. The left ventricular ejection fraction is 25-35% by visual estimate. There is moderate to severe left ventricular systolic dysfunction. LV end diastolic pressure is mildly elevated. There is no mitral valve regurgitation.   1. Mild non-obstructive CAD.  2. The LAD and large diagonal branch have no obstructive disease. The diagonal branch is similar in caliber to the LAD. The LAD does not reach the apex. The Circumflex artery has no obstructive disease. The RCA is large and gives off a small to moderate caliber PDA branch. This branch tapers down to a small caliber vessel distally. Cannot exclude some diffuse plaque in this distal PDA branch but it is too small for PCI.  3. Moderately severe LV systolic dysfunction with segmental hypokinesis of the inferoapical, apical and anteroapical walls c/w Takotsubo's cardiomyopathy.    Recommendations. Admit to telemetry unit. Echo in am. Will treat with ASA, statin, beta blocker and Ace-inh.    EKG:  EKG is ordered today.  The EKG ordered today demonstrates NSR, 77 bpm, no acute ST-T changes  Recent Labs: 12/30/2020: BUN 15; Creatinine, Ser 0.93; Hemoglobin 14.4; Platelets 262; Potassium 4.3; Sodium 139 01/16/2021: TSH  7.540  Recent Lipid Panel    Component Value Date/Time   CHOL 150 01/07/2016 0609   TRIG 96 01/07/2016 0609   HDL 32 (L) 01/07/2016 0609   CHOLHDL 4.7 01/07/2016 0609   VLDL 19 01/07/2016 0609   LDLCALC 99 01/07/2016 0609    PHYSICAL EXAM:    VS:  BP (!) 132/100   Pulse 77  Ht '5\' 9"'$  (1.753 m)   Wt 234 lb 3.2 oz (106.2 kg)   LMP 10/20/2019   SpO2 97%   BMI 34.59 kg/m   BMI: Body mass index is 34.59 kg/m.  Physical Exam Vitals reviewed.  Constitutional:      Appearance: She is well-developed.  HENT:     Head: Normocephalic and atraumatic.  Eyes:     General:        Right eye: No discharge.        Left eye: No discharge.  Neck:     Vascular: No JVD.  Cardiovascular:     Rate and Rhythm: Normal rate and regular rhythm.     Pulses:          Posterior tibial pulses are 2+ on the right side and 2+ on the left side.     Heart sounds: Normal heart sounds, S1 normal and S2 normal. Heart sounds not distant. No midsystolic click and no opening snap. No murmur heard.    No friction rub.  Pulmonary:     Effort: Pulmonary effort is normal. No respiratory distress.     Breath sounds: Normal breath sounds. No decreased breath sounds, wheezing or rales.  Chest:     Chest wall: No tenderness.  Abdominal:     General: There is no distension.     Palpations: Abdomen is soft.  Musculoskeletal:     Cervical back: Normal range of motion.  Skin:    General: Skin is warm and dry.     Nails: There is no clubbing.  Neurological:     Mental Status: She is alert and oriented to person, place, and time.  Psychiatric:        Speech: Speech normal.        Behavior: Behavior normal.        Thought Content: Thought content normal.        Judgment: Judgment normal.     Wt Readings from Last 3 Encounters:  10/24/21 234 lb 3.2 oz (106.2 kg)  02/10/21 229 lb (103.9 kg)  01/16/21 225 lb (102.1 kg)     ASSESSMENT & PLAN:   Nonobstructive CAD: She is doing well without any symptoms  concerning for angina.  Recent coronary CTA in 01/2021 showed no evidence of obstructive CAD.  Continue aggressive risk factor modification and primary prevention including aspirin and carvedilol.  We will resume atorvastatin.  Most recent LDL of 78.  No indication for further ischemic testing at this time.  Takotsubo cardiomyopathy: She appears euvolemic and well compensated and is not requiring a standing diuretic.  Most recent echo demonstrated normalization of LV systolic function.  She remains on carvedilol.  HTN: Blood pressure is mildly elevated in the office today.  We will titrate lisinopril to 10 mg daily.  She will otherwise continue carvedilol 6.25 mg twice daily.  Tobacco use: She is working on tapering.  Complete cessation is encouraged.    Disposition: F/u with Dr. Saunders Revel or an APP in 6 months.   Medication Adjustments/Labs and Tests Ordered: Current medicines are reviewed at length with the patient today.  Concerns regarding medicines are outlined above. Medication changes, Labs and Tests ordered today are summarized above and listed in the Patient Instructions accessible in Encounters.   Signed, Christell Faith, PA-C 10/24/2021 12:03 PM     [address], [place] [id number] [phone]

## 2021-10-24 ENCOUNTER — Ambulatory Visit: Payer: BC Managed Care – PPO | Admitting: Physician Assistant

## 2021-10-24 ENCOUNTER — Encounter: Payer: Self-pay | Admitting: Physician Assistant

## 2021-10-24 VITALS — BP 132/100 | HR 77 | Ht 69.0 in | Wt 234.2 lb

## 2021-10-24 DIAGNOSIS — I5181 Takotsubo syndrome: Secondary | ICD-10-CM | POA: Diagnosis not present

## 2021-10-24 DIAGNOSIS — I10 Essential (primary) hypertension: Secondary | ICD-10-CM

## 2021-10-24 DIAGNOSIS — I2510 Atherosclerotic heart disease of native coronary artery without angina pectoris: Secondary | ICD-10-CM | POA: Diagnosis not present

## 2021-10-24 DIAGNOSIS — Z720 Tobacco use: Secondary | ICD-10-CM

## 2021-10-24 MED ORDER — ATORVASTATIN CALCIUM 40 MG PO TABS: 40.0000 mg | ORAL_TABLET | Freq: Every day | ORAL | 3 refills | Status: DC

## 2021-10-24 MED ORDER — LISINOPRIL 10 MG PO TABS: 10.0000 mg | ORAL_TABLET | Freq: Every day | ORAL | 3 refills | Status: DC

## 2021-10-24 NOTE — Patient Instructions (Signed)
Medication Instructions:  Your physician has recommended you make the following change in your medication:   INCREASE Lisinopril 10 mg once daily    *If you need a refill on your cardiac medications before your next appointment, please call your pharmacy*   Lab Work: None  If you have labs (blood work) drawn today and your tests are completely normal, you will receive your results only by: [place] (if you have MyChart) OR A paper copy in the mail If you have any lab test that is abnormal or we need to change your treatment, we will call you to review the results.   Testing/Procedures: None   Follow-Up: At [facility], you and your health needs are our priority.  As part of our continuing mission to provide you with exceptional heart care, we have created designated Provider Care Teams.  These Care Teams include your primary Cardiologist (physician) and Advanced Practice Providers (APPs -  Physician Assistants and Nurse Practitioners) who all work together to provide you with the care you need, when you need it.   Your next appointment:   6 month(s)  The format for your next appointment:   In Person  Provider:   Nelva Bush, MD or Christell Faith, PA-C    Important Information About Sugar

## 2022-01-10 ENCOUNTER — Other Ambulatory Visit: Payer: Self-pay

## 2022-01-10 ENCOUNTER — Encounter: Payer: Self-pay | Admitting: Emergency Medicine

## 2022-01-10 DIAGNOSIS — R197 Diarrhea, unspecified: Secondary | ICD-10-CM | POA: Insufficient documentation

## 2022-01-10 DIAGNOSIS — I10 Essential (primary) hypertension: Secondary | ICD-10-CM | POA: Diagnosis not present

## 2022-01-10 DIAGNOSIS — Z7982 Long term (current) use of aspirin: Secondary | ICD-10-CM | POA: Diagnosis not present

## 2022-01-10 DIAGNOSIS — R1032 Left lower quadrant pain: Secondary | ICD-10-CM | POA: Insufficient documentation

## 2022-01-10 DIAGNOSIS — Z79899 Other long term (current) drug therapy: Secondary | ICD-10-CM | POA: Insufficient documentation

## 2022-01-10 LAB — URINALYSIS, ROUTINE W REFLEX MICROSCOPIC
Bacteria, UA: NONE SEEN
Bilirubin Urine: NEGATIVE
Glucose, UA: NEGATIVE mg/dL
Ketones, ur: NEGATIVE mg/dL
Leukocytes,Ua: NEGATIVE
Nitrite: NEGATIVE
Protein, ur: NEGATIVE mg/dL
Specific Gravity, Urine: 1.006 (ref 1.005–1.030)
pH: 6 (ref 5.0–8.0)

## 2022-01-10 LAB — COMPREHENSIVE METABOLIC PANEL
ALT: 10 U/L (ref 0–44)
AST: 14 U/L — ABNORMAL LOW (ref 15–41)
Albumin: 4.1 g/dL (ref 3.5–5.0)
Alkaline Phosphatase: 117 U/L (ref 38–126)
Anion gap: 8 (ref 5–15)
BUN: 12 mg/dL (ref 6–20)
CO2: 27 mmol/L (ref 22–32)
Calcium: 9 mg/dL (ref 8.9–10.3)
Chloride: 104 mmol/L (ref 98–111)
Creatinine, Ser: 0.98 mg/dL (ref 0.44–1.00)
GFR, Estimated: >60 mL/min (ref >60)
Glucose, Bld: 118 mg/dL — ABNORMAL HIGH (ref 70–99)
Potassium: 3.5 mmol/L (ref 3.5–5.1)
Sodium: 139 mmol/L (ref 135–145)
Total Bilirubin: 0.6 mg/dL (ref 0.3–1.2)
Total Protein: 8.2 g/dL — ABNORMAL HIGH (ref 6.5–8.1)

## 2022-01-10 LAB — CBC WITH DIFFERENTIAL/PLATELET
Abs Immature Granulocytes: 0.02 10*3/uL (ref 0.00–0.07)
Basophils Absolute: 0.1 10*3/uL (ref 0.0–0.1)
Basophils Relative: 1 %
Eosinophils Absolute: 0.2 10*3/uL (ref 0.0–0.5)
Eosinophils Relative: 2 %
HCT: 43.6 % (ref 36.0–46.0)
Hemoglobin: 14.3 g/dL (ref 12.0–15.0)
Immature Granulocytes: 0 %
Lymphocytes Relative: 30 %
Lymphs Abs: 3 10*3/uL (ref 0.7–4.0)
MCH: 31.2 pg (ref 26.0–34.0)
MCHC: 32.8 g/dL (ref 30.0–36.0)
MCV: 95 fL (ref 80.0–100.0)
Monocytes Absolute: 0.7 10*3/uL (ref 0.1–1.0)
Monocytes Relative: 7 %
Neutro Abs: 6 10*3/uL (ref 1.7–7.7)
Neutrophils Relative %: 60 %
Platelets: 262 10*3/uL (ref 150–400)
RBC: 4.59 MIL/uL (ref 3.87–5.11)
RDW: 13.7 % (ref 11.5–15.5)
WBC: 9.9 10*3/uL (ref 4.0–10.5)
nRBC: 0 % (ref 0.0–0.2)

## 2022-01-10 LAB — MAGNESIUM: Magnesium: 1.9 mg/dL (ref 1.7–2.4)

## 2022-01-10 NOTE — ED Triage Notes (Signed)
  Patient comes in with lower abdominal pain that started earlier today.  Patient states she had several episodes of diarrhea but felt fine.  Today around lunch time she started having sharp cramping in lower abdomen that she states is similar to labor pains.  Has nausea with no vomiting.  No urinary symptoms.  Pain 10/10, sharp/cramping.

## 2022-01-11 ENCOUNTER — Emergency Department: Payer: BC Managed Care – PPO

## 2022-01-11 ENCOUNTER — Emergency Department
Admission: EM | Admit: 2022-01-11 | Discharge: 2022-01-11 | Disposition: A | Discharge status: Home / Self Care | Payer: BC Managed Care – PPO | Attending: Emergency Medicine | Admitting: Emergency Medicine

## 2022-01-11 DIAGNOSIS — A09 Infectious gastroenteritis and colitis, unspecified: Secondary | ICD-10-CM

## 2022-01-11 DIAGNOSIS — R1032 Left lower quadrant pain: Secondary | ICD-10-CM

## 2022-01-11 MED ORDER — DICYCLOMINE HCL 20 MG PO TABS: 20.0000 mg | ORAL_TABLET | Freq: Three times a day (TID) | ORAL | 0 refills | Status: DC | PRN

## 2022-01-11 MED ORDER — ONDANSETRON HCL 4 MG/2ML IJ SOLN
4.0000 mg | Freq: Once | INTRAMUSCULAR | Status: AC
  Administered 2022-01-11: 4 mg via INTRAVENOUS
  Filled 2022-01-11: qty 2

## 2022-01-11 MED ORDER — MORPHINE SULFATE (PF) 4 MG/ML IV SOLN
4.0000 mg | Freq: Once | INTRAVENOUS | Status: AC
  Administered 2022-01-11: 4 mg via INTRAVENOUS
  Filled 2022-01-11: qty 1

## 2022-01-11 MED ORDER — LOPERAMIDE HCL 2 MG PO TABS: 2.0000 mg | ORAL_TABLET | Freq: Four times a day (QID) | ORAL | 0 refills | Status: DC | PRN

## 2022-01-11 MED ORDER — ONDANSETRON 4 MG PO TBDP: 4.0000 mg | ORAL_TABLET | Freq: Four times a day (QID) | ORAL | 0 refills | Status: DC | PRN

## 2022-01-11 MED ORDER — IOHEXOL 300 MG/ML  SOLN
100.0000 mL | Freq: Once | INTRAMUSCULAR | Status: AC | PRN
  Administered 2022-01-11: 100 mL via INTRAVENOUS

## 2022-01-11 MED ORDER — SODIUM CHLORIDE 0.9 % IV BOLUS (SEPSIS)
1000.0000 mL | Freq: Once | INTRAVENOUS | Status: AC
  Administered 2022-01-11: 1000 mL via INTRAVENOUS

## 2022-01-11 NOTE — ED Notes (Signed)
Pt brought to ED rm 9, this RN now assuming care.

## 2022-01-11 NOTE — ED Provider Notes (Signed)
[facility] Provider Note    Event Date/Time   First MD Initiated Contact with Patient 01/11/22 0120     (approximate)   History   Abdominal Pain and Diarrhea   HPI  Sarah Mills is a 46 y.o. female history of hypertension, Takotsubo's cardiomyopathy in 2017, IBS, previous hysterectomy in 2021 who presents to the emergency department with complaints of lower abdominal pain that is crampy in nature worse in the left lower quadrant with diarrhea.  No fevers, chest pain, shortness of breath, nausea, vomiting, bloody stools, melena, vaginal bleeding or discharge, dysuria or hematuria.  No sick contacts, recent travel, antibiotic use or hospitalization.   History provided by patient and family member at bedside.    Past Medical History:  Diagnosis Date   Dysmenorrhea    Headache    Hypertension    IBS (irritable bowel syndrome)    Menorrhagia    NSTEMI (non-ST elevated myocardial infarction) ([place])    a. 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   Obesity    Takotsubo cardiomyopathy    a. 12/2015 Cath: Nl cors - ? plaque in small RPDA.  EF 25-35%; b. 12/2015 Echo: EF 40-45%; c. 04/2016 Echo: EF 55-60%, no rwma. Nl RV fxn.   Tobacco abuse     Past Surgical History:  Procedure Laterality Date   CARDIAC CATHETERIZATION N/A 01/06/2016   Procedure: Left Heart Cath and Coronary Angiography;  Surgeon: Burnell Blanks, MD;  Location: [place] [facility];  Service: Cardiovascular;  Laterality: N/A;   CYSTOSCOPY N/A 11/16/2019   Procedure: CYSTOSCOPY;  Surgeon: Will Bonnet, MD;  Location: [facility];  Service: Gynecology;  Laterality: N/A;   TOTAL LAPAROSCOPIC HYSTERECTOMY WITH SALPINGECTOMY Bilateral 11/16/2019   Procedure: TOTAL LAPAROSCOPIC HYSTERECTOMY WITH BILATERIAL SALPINGECTOMY;  Surgeon: Will Bonnet, MD;  Location: [facility];  Service: Gynecology;  Laterality: Bilateral;   TUBAL LIGATION      MEDICATIONS:   Prior to Admission medications   Medication Sig Start Date End Date Taking? Authorizing Provider  acetaminophen (TYLENOL) 500 MG tablet Take 1,000 mg by mouth every 6 (six) hours as needed for moderate pain.     [provider]  aspirin EC 81 MG tablet Take 1 tablet (81 mg total) by mouth daily. 11/16/19   Will Bonnet, MD  atorvastatin (LIPITOR) 40 MG tablet Take 1 tablet (40 mg total) by mouth daily. 10/24/21   Dunn, Areta Haber, PA-C  carvedilol (COREG) 6.25 MG tablet TAKE 1 TABLET(6.25 MG) BY MOUTH TWICE DAILY WITH A MEAL 03/10/21   Furth, Cadence H, PA-C  cyclobenzaprine (FLEXERIL) 5 MG tablet Take 5 mg by mouth 3 (three) times daily as needed for muscle spasms. 10/21/21   [provider]  levothyroxine (SYNTHROID) 50 MCG tablet Take 50 mcg by mouth daily. 10/09/21   [provider]  lisinopril (ZESTRIL) 10 MG tablet Take 1 tablet (10 mg total) by mouth daily. 10/24/21 01/22/22  Rise Mu, PA-C  nitroGLYCERIN (NITROSTAT) 0.4 MG SL tablet Place 1 tablet (0.4 mg total) under the tongue every 5 (five) minutes as needed for chest pain. 12/31/20 12/31/21  Rudene Re, MD    Physical Exam   Triage Vital Signs: ED Triage Vitals  Enc Vitals Group     BP 01/10/22 2232 (!) 156/91     Pulse Rate 01/10/22 2232 90     Resp 01/10/22 2232 18     Temp 01/10/22 2232 97.6 F (36.4 C)  Temp Source 01/10/22 2232 Oral     SpO2 01/10/22 2232 99 %     Weight 01/10/22 2233 240 lb (108.9 kg)     Height 01/10/22 2233 '5\' 9"'$  (1.753 m)     Head Circumference --      Peak Flow --      Pain Score 01/10/22 2232 10     Pain Loc --      Pain Edu? --      Excl. in [place]? --     Most recent vital signs: Vitals:   01/11/22 0200 01/11/22 0230  BP: 122/85 137/87  Pulse: 66 69  Resp:  18  Temp:    SpO2: 95% 96%    CONSTITUTIONAL: Alert and oriented and responds appropriately to questions. Well-appearing; well-nourished HEAD: Normocephalic, atraumatic EYES: Conjunctivae clear,  pupils appear equal, sclera nonicteric ENT: normal nose; moist mucous membranes NECK: Supple, normal ROM CARD: RRR; S1 and S2 appreciated; no murmurs, no clicks, no rubs, no gallops RESP: Normal chest excursion without splinting or tachypnea; breath sounds clear and equal bilaterally; no wheezes, no rhonchi, no rales, no hypoxia or respiratory distress, speaking full sentences ABD/GI: Normal bowel sounds; non-distended; soft, tender to palpation in the left lower quadrant without guarding or rebound BACK: The back appears normal EXT: Normal ROM in all joints; no deformity noted, no edema; no cyanosis SKIN: Normal color for age and race; warm; no rash on exposed skin NEURO: Moves all extremities equally, normal speech PSYCH: The patient's mood and manner are appropriate.   ED Results / Procedures / Treatments   LABS: (all labs ordered are listed, but only abnormal results are displayed) Labs Reviewed  COMPREHENSIVE METABOLIC PANEL - Abnormal; Notable for the following components:      Result Value   Glucose, Bld 118 (*)    Total Protein 8.2 (*)    AST 14 (*)    All other components within normal limits  URINALYSIS, ROUTINE W REFLEX MICROSCOPIC - Abnormal; Notable for the following components:   Color, Urine YELLOW (*)    APPearance HAZY (*)    Hgb urine dipstick MODERATE (*)    All other components within normal limits  CBC WITH DIFFERENTIAL/PLATELET  MAGNESIUM     EKG:   RADIOLOGY: My personal review and interpretation of imaging: CT scan shows no acute abnormality.  No diverticulitis.  Normal appendix.  No colitis.  I have personally reviewed all radiology reports.   CT ABDOMEN PELVIS W CONTRAST  Result Date: 01/11/2022 CLINICAL DATA:  Left lower quadrant abdominal pain EXAM: CT ABDOMEN AND PELVIS WITH CONTRAST TECHNIQUE: Multidetector CT imaging of the abdomen and pelvis was performed using the standard protocol following bolus administration of intravenous contrast.  RADIATION DOSE REDUCTION: This exam was performed according to the departmental dose-optimization program which includes automated exposure control, adjustment of the mA and/or kV according to patient size and/or use of iterative reconstruction technique. CONTRAST:  170m OMNIPAQUE IOHEXOL 300 MG/ML  SOLN COMPARISON:  None Available. FINDINGS: Lower chest: No acute abnormality. Hepatobiliary: No focal liver abnormality is seen. No gallstones, gallbladder wall thickening, or biliary dilatation. Pancreas: Unremarkable Spleen: Unremarkable Adrenals/Urinary Tract: Adrenal glands are unremarkable. Kidneys are normal, without renal calculi, focal lesion, or hydronephrosis. Bladder is unremarkable. Stomach/Bowel: Stomach is within normal limits. Appendix appears normal. No evidence of bowel wall thickening, distention, or inflammatory changes. Vascular/Lymphatic: No significant vascular findings are present. No enlarged abdominal or pelvic lymph nodes. Reproductive: 3 cm simple appearing cyst noted within the left  ovary, likely representing a follicular cyst in a patient of this age. No follow-up imaging is recommended. Status post hysterectomy. Other: No abdominal wall hernia or abnormality. No abdominopelvic ascites. Musculoskeletal: No acute or significant osseous findings. IMPRESSION: No acute intra-abdominal pathology identified. No definite radiographic explanation for the patient's reported symptoms. Electronically Signed   By: Fidela Salisbury M.D.   On: 01/11/2022 02:47     PROCEDURES:  Critical Care performed: No      Procedures    IMPRESSION / MDM / ASSESSMENT AND PLAN / ED COURSE  I reviewed the triage vital signs and the nursing notes.    Patient with complaints of left lower quadrant abdominal pain, diarrhea.     DIFFERENTIAL DIAGNOSIS (includes but not limited to):   Diverticulitis, colitis, bowel obstruction, UTI, kidney stone, pyelonephritis, less likely appendicitis   Patient's  presentation is most consistent with acute presentation with potential threat to life or bodily function.   PLAN: CBC, CMP, urinalysis obtained from triage.  No leukocytosis.  Normal electrolytes, LFTs.  Urine shows no sign of infection.  Will obtain CT of the abdomen pelvis.  Will give IV fluids, pain and nausea medicine.   MEDICATIONS GIVEN IN ED: Medications  sodium chloride 0.9 % bolus 1,000 mL (1,000 mLs Intravenous New Bag/Given 01/11/22 0150)  ondansetron (ZOFRAN) injection 4 mg (4 mg Intravenous Given 01/11/22 0150)  morphine (PF) 4 MG/ML injection 4 mg (4 mg Intravenous Given 01/11/22 0150)  iohexol (OMNIPAQUE) 300 MG/ML solution 100 mL (100 mLs Intravenous Contrast Given 01/11/22 0209)     ED COURSE: Patient CT scan reviewed and interpreted by myself and the radiologist and shows no acute abnormality.  Patient reports feeling better.  Suspect possible viral illness causing symptoms.  Discussed supportive care instructions and return precautions.  Will discharge with Bentyl, Zofran, and Imodium.  Recommended Tylenol and Motrin over-the-counter as needed.  Recommended bland diet.  Low suspicion for C. difficile.  No recent antibiotic use or hospitalization.  She has not had any diarrhea here to send a stool study.   At this time, I do not feel there is any life-threatening condition present. I reviewed all nursing notes, vitals, pertinent previous records.  All lab and urine results, EKGs, imaging ordered have been independently reviewed and interpreted by myself.  I reviewed all available radiology reports from any imaging ordered this visit.  Based on my assessment, I feel the patient is safe to be discharged home without further emergent workup and can continue workup as an outpatient as needed. Discussed all findings, treatment plan as well as usual and customary return precautions.  They verbalize understanding and are comfortable with this plan.  Outpatient follow-up has been provided as  needed.  All questions have been answered.    CONSULTS: No admission at this time.  Patient is well-appearing without clinical signs of dehydration.  Feeling better here.  CTs shows no acute abnormality.   OUTSIDE RECORDS REVIEWED: Reviewed patient's last office visit with Dr. Ola Spurr on 10/21/2021.       FINAL CLINICAL IMPRESSION(S) / ED DIAGNOSES   Final diagnoses:  LLQ abdominal pain  Diarrhea of infectious origin     Rx / DC Orders   ED Discharge Orders          Ordered    ondansetron (ZOFRAN-ODT) 4 MG disintegrating tablet  Every 6 hours PRN        01/11/22 0320    dicyclomine (BENTYL) 20 MG tablet  Every 8 hours PRN  01/11/22 0320    loperamide (IMODIUM A-D) 2 MG tablet  4 times daily PRN        01/11/22 0320             Note:  This document was prepared using Dragon voice recognition software and may include unintentional dictation errors.   Melaney Tellefsen, Delice Bison, DO 01/11/22 [phone]

## 2022-01-11 NOTE — ED Notes (Signed)
Signing pad not working, pt verbalized understanding of DC instructions.

## 2022-01-11 NOTE — ED Notes (Signed)
ED Provider at bedside. 

## 2022-01-11 NOTE — Discharge Instructions (Signed)
You may alternate Tylenol 1000 mg every 6 hours as needed for pain, fever and Ibuprofen 800 mg every 6-8 hours as needed for pain, fever.  Please take Ibuprofen with food.  Do not take more than 4000 mg of Tylenol (acetaminophen) in a 24 hour period. ° °

## 2022-03-10 ENCOUNTER — Other Ambulatory Visit: Payer: Self-pay

## 2022-03-10 ENCOUNTER — Emergency Department
Admission: EM | Admit: 2022-03-10 | Discharge: 2022-03-10 | Disposition: A | Discharge status: Home / Self Care | Payer: BC Managed Care – PPO | Attending: Emergency Medicine | Admitting: Emergency Medicine

## 2022-03-10 ENCOUNTER — Emergency Department: Payer: BC Managed Care – PPO

## 2022-03-10 DIAGNOSIS — R079 Chest pain, unspecified: Secondary | ICD-10-CM | POA: Diagnosis not present

## 2022-03-10 DIAGNOSIS — M79601 Pain in right arm: Secondary | ICD-10-CM | POA: Insufficient documentation

## 2022-03-10 DIAGNOSIS — I10 Essential (primary) hypertension: Secondary | ICD-10-CM | POA: Insufficient documentation

## 2022-03-10 DIAGNOSIS — R61 Generalized hyperhidrosis: Secondary | ICD-10-CM | POA: Insufficient documentation

## 2022-03-10 LAB — CBC
HCT: 39.5 % (ref 36.0–46.0)
Hemoglobin: 13.2 g/dL (ref 12.0–15.0)
MCH: 31.2 pg (ref 26.0–34.0)
MCHC: 33.4 g/dL (ref 30.0–36.0)
MCV: 93.4 fL (ref 80.0–100.0)
Platelets: 223 10*3/uL (ref 150–400)
RBC: 4.23 MIL/uL (ref 3.87–5.11)
RDW: 13.2 % (ref 11.5–15.5)
WBC: 5.9 10*3/uL (ref 4.0–10.5)
nRBC: 0 % (ref 0.0–0.2)

## 2022-03-10 LAB — BASIC METABOLIC PANEL
Anion gap: 6 (ref 5–15)
BUN: 13 mg/dL (ref 6–20)
CO2: 26 mmol/L (ref 22–32)
Calcium: 8.8 mg/dL — ABNORMAL LOW (ref 8.9–10.3)
Chloride: 103 mmol/L (ref 98–111)
Creatinine, Ser: 0.77 mg/dL (ref 0.44–1.00)
GFR, Estimated: >60 mL/min (ref >60)
Glucose, Bld: 104 mg/dL — ABNORMAL HIGH (ref 70–99)
Potassium: 4.1 mmol/L (ref 3.5–5.1)
Sodium: 135 mmol/L (ref 135–145)

## 2022-03-10 LAB — TROPONIN I (HIGH SENSITIVITY)
Troponin I (High Sensitivity): <2 ng/L (ref <18)
Troponin I (High Sensitivity): 3 ng/L (ref <18)

## 2022-03-10 NOTE — ED Provider Notes (Signed)
[facility] Provider Note    Event Date/Time   First MD Initiated Contact with Patient 03/10/22 1104     (approximate)  History   Chief Complaint: Chest Pain  HPI  Sarah Mills is a 46 y.o. female with a past medical history of hypertension, prior MI, presents to the emergency department for chest pain.  According to the patient for the past several days she has been experiencing intermittent pain in her right arm.  States this morning she became somewhat diaphoretic and had pain/pressure in her right arm/chest.  Given the patient's MI in 2017 she was concerned so she came to the emergency department for evaluation.  Patient received aspirin and nitroglycerin ointment by EMS.  On arrival patient denies any pain in the chest.  Denies any shortness of breath at any point.  No nausea.  Patient denies any cardiac stents.  Follows up with Dr. Saunders Revel of cardiology.  Physical Exam   Triage Vital Signs: ED Triage Vitals  Enc Vitals Group     BP 03/10/22 1011 106/78     Pulse Rate 03/10/22 1011 71     Resp 03/10/22 1011 18     Temp 03/10/22 1011 98.4 F (36.9 C)     Temp Source 03/10/22 1011 Oral     SpO2 03/10/22 1011 98 %     Weight 03/10/22 1109 240 lb 1.3 oz (108.9 kg)     Height 03/10/22 1109 '5\' 9"'$  (1.753 m)     Head Circumference --      Peak Flow --      Pain Score 03/10/22 1009 7     Pain Loc --      Pain Edu? --      Excl. in [place]? --     Most recent vital signs: Vitals:   03/10/22 1011  BP: 106/78  Pulse: 71  Resp: 18  Temp: 98.4 F (36.9 C)  SpO2: 98%    General: Awake, no distress.  CV:  Good peripheral perfusion.  Regular rate and rhythm  Resp:  Normal effort.  Equal breath sounds bilaterally.  Abd:  No distention.  Soft, nontender.  No rebound or guarding. Other:  2+ radial pulse in the right upper extremity good range of motion all joints.  No edema.  ED Results / Procedures / Treatments   EKG  EKG viewed and interpreted by  myself shows a normal sinus rhythm at 73 bpm with a narrow QRS, normal axis, normal intervals, no concerning ST changes  RADIOLOGY  I reviewed and interpreted the chest x-ray images.  No consolidation seen on my evaluation. Radiology has read the x-ray as negative   [place] ED: Medications - No data to display   IMPRESSION / MDM / [place] / ED COURSE  I reviewed the triage vital signs and the nursing notes.  Patient's presentation is most consistent with acute presentation with potential threat to life or bodily function.  Patient presents emergency department for chest pain.  Patient states several days of intermittent pain in her right arm and since around 9 AM this morning developed pain in her right chest along with mild amount of diaphoresis.  Patient received nitroglycerin and aspirin by EMS and is now chest pain-free.  Denies any shortness of breath or nausea at any point.  Patient's vital signs reassuring.  Physical exam is reassuring.  Patient's lab work is thus far reassuring with a negative troponin, normal CBC, reassuring chemistry.  Clear chest x-ray and a reassuring EKG.  Given the onset of symptoms approximately 2 and half hours ago we will recheck a cardiac enzyme at the 2-hour mark.  If the patient's cardiac enzyme remains negative I believe the patient will be safe for discharge home and cardiology follow-up.  Patient agreeable with this plan.  Repeat troponin is negative.  Patient feels well without chest pain we will discharge with her cardiology follow-up.  FINAL CLINICAL IMPRESSION(S) / ED DIAGNOSES   Chest pain   Note:  This document was prepared using Dragon voice recognition software and may include unintentional dictation errors.   Harvest Dark, MD 03/10/22 1330

## 2022-03-10 NOTE — ED Triage Notes (Signed)
Pt to ED via [facility] from home. Pt reports she was at work and started feeling diaphoretic and having centralized CP. EMS gave 324 ASA and nitro paste.

## 2022-03-10 NOTE — ED Triage Notes (Signed)
Ems report : pt ems from work for chest pain that started 1 hr ago. Pt with hx MI 2018. Ems gave aspirin '81mg'$  x 4 and !" Nitro paste.

## 2022-04-06 ENCOUNTER — Other Ambulatory Visit: Payer: Self-pay

## 2022-04-06 MED ORDER — CARVEDILOL 6.25 MG PO TABS: ORAL_TABLET | ORAL | 0 refills | Status: AC

## 2022-04-10 ENCOUNTER — Other Ambulatory Visit: Payer: Self-pay | Admitting: Infectious Diseases

## 2022-04-10 DIAGNOSIS — Z1231 Encounter for screening mammogram for malignant neoplasm of breast: Secondary | ICD-10-CM

## 2022-05-05 NOTE — Progress Notes (Unsigned)
Cardiology Office Note    Date:  05/06/2022   ID:  Sarah Mills, DOB 06-03-1975, MRN [id number]  PCP:  Leonel Ramsay, MD  Cardiologist:  Nelva Bush, MD  Electrophysiologist:  None   Chief Complaint: Follow up  History of Present Illness:   Sarah Mills is a 46 y.o. female with history of minimal nonobstructive CAD, Takotsubo cardiomyopathy, menorrhagia with dysmenorrhea, and tobacco use who presents for follow-up of her cardiomyopathy.   Her cardiac history dates back to 12/2015, at which time she was admitted following sudden onset of chest pressure associated with dyspnea and diaphoresis.  Initial troponin was elevated at 0.89 and EKG was notable for new T wave inversion in leads V3 through V5.  She was admitted for management of NSTEMI with diagnostic LHC revealing minimal plaque in the rPDA with otherwise normal coronary arteries.  EF was 25 to 35% by LV gram and 40 to 45% by echo.  Medical therapy was initiated.  Follow-up echo in 04/2016 demonstrated an improvement in her LV systolic function with an EF of 55 to 60% without regional wall motion abnormalities.  Financial constraints have previously limited medical therapy briefly.   She was seen in the ED in 12/2020 with chest pain with negative D-dimer and high sensitive troponin negative x2.   Coronary CTA in 01/2021 demonstrated a calcium score of 0 with no evidence of CAD.  Echo in 01/2021 showed an EF of 60 to 65%, no regional wall motion abnormalities, normal LV diastolic function parameters, normal RV systolic function and ventricular cavity size, and no significant valvular abnormalities.   She was last seen in the office in 10/2021 and was without symptoms of angina or decompensation.  She was tapering tobacco use.  She was seen in the ED in 02/2022 with right arm pain and chest discomfort.  High-sensitivity troponin negative x 2.  EKG nonacute.    She comes in doing well from a cardiac perspective and is  without symptoms of angina or decompensation.  She does note a several month history of right hip and lower extremity pain/cramping with prolonged ambulation that improves with rest.  No lower extremity swelling, warmth, erythema, or nonhealing wounds.  She did have 1 mechanical fall since she was last seen, tripping while playing miniature golf after hitting a hole in 1.  She did not hit her head or suffer LOC.  She is tolerating cardiac medications without issues.  She has tapered her tobacco use to 2 cigarettes/day.  She is working on eating a heart healthy diet.  Overall, she feels like she is doing well from a cardiac perspective.   Labs independently reviewed: 02/2022 - Hgb 13.2, PLT 223, potassium 4.1, BUN 13, SCr 0.77 01/2022 - magnesium 1.9, albumin 4.1, AST/ALT not elevated 12/2021 - TSH normal 02/2021 - TC 145, TG 108, HDL 45, LDL 78, A1c 5.6    Past Medical History:  Diagnosis Date   Dysmenorrhea    Headache    Hypertension    IBS (irritable bowel syndrome)    Menorrhagia    NSTEMI (non-ST elevated myocardial infarction) ([place])    a. 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   Obesity    Takotsubo cardiomyopathy    a. 12/2015 Cath: Nl cors - ? plaque in small RPDA.  EF 25-35%; b. 12/2015 Echo: EF 40-45%; c. 04/2016 Echo: EF 55-60%, no rwma. Nl RV fxn.   Tobacco abuse     Past Surgical History:  Procedure  Laterality Date   CARDIAC CATHETERIZATION N/A 01/06/2016   Procedure: Left Heart Cath and Coronary Angiography;  Surgeon: Burnell Blanks, MD;  Location: [facility];  Service: Cardiovascular;  Laterality: N/A;   CYSTOSCOPY N/A 11/16/2019   Procedure: CYSTOSCOPY;  Surgeon: Will Bonnet, MD;  Location: [facility];  Service: Gynecology;  Laterality: N/A;   TOTAL LAPAROSCOPIC HYSTERECTOMY WITH SALPINGECTOMY Bilateral 11/16/2019   Procedure: TOTAL LAPAROSCOPIC HYSTERECTOMY WITH BILATERIAL SALPINGECTOMY;  Surgeon: Will Bonnet, MD;   Location: [facility];  Service: Gynecology;  Laterality: Bilateral;   TUBAL LIGATION      Current Medications: Current Meds  Medication Sig   acetaminophen (TYLENOL) 500 MG tablet Take 1,000 mg by mouth every 6 (six) hours as needed for moderate pain.    aspirin EC 81 MG tablet Take 1 tablet (81 mg total) by mouth daily.   atorvastatin (LIPITOR) 40 MG tablet Take 1 tablet (40 mg total) by mouth daily.   carvedilol (COREG) 6.25 MG tablet TAKE 1 TABLET(6.25 MG) BY MOUTH TWICE DAILY WITH A MEAL   cyclobenzaprine (FLEXERIL) 5 MG tablet Take 5 mg by mouth 3 (three) times daily as needed for muscle spasms.   levothyroxine (SYNTHROID) 50 MCG tablet Take 50 mcg by mouth daily.   lisinopril (ZESTRIL) 10 MG tablet Take 1 tablet (10 mg total) by mouth daily.   nitroGLYCERIN (NITROSTAT) 0.4 MG SL tablet Place 1 tablet (0.4 mg total) under the tongue every 5 (five) minutes as needed for chest pain.    Allergies:   Patient has no known allergies.   Social History   Socioeconomic History   Marital status: Married    Spouse name: Not on file   Number of children: Not on file   Years of education: Not on file   Highest education level: Not on file  Occupational History   Not on file  Tobacco Use   Smoking status: Some Days    Packs/day: 0.25    Years: 28.00    Total pack years: 7.00    Types: Cigarettes   Smokeless tobacco: Never   Tobacco comments:    Smokes 2 cigarettes daily   Vaping Use   Vaping Use: Never used  Substance and Sexual Activity   Alcohol use: No   Drug use: No   Sexual activity: Not Currently    Birth control/protection: None  Other Topics Concern   Not on file  Social History Narrative   Lives in [place] w/ son, dtr, and grandchild.  Works @ a [employer].  Active @ work and goes for walks with her granddtr regularly.   Social Determinants of Health   Financial Resource Strain: Not on file  Food Insecurity: Not on file  Transportation Needs: Not on file  Physical  Activity: Not on file  Stress: Not on file  Social Connections: Not on file     Family History:  The patient's family history includes CAD in her father and maternal grandfather; Heart attack (age of onset: 45) in her father; Heart disease in her father.  ROS:   12-point review of systems is negative unless otherwise noted in the HPI.   EKGs/Labs/Other Studies Reviewed:    Studies reviewed were summarized above. The additional studies were reviewed today:  Coronary CTA 01/23/2021: FINDINGS: Aorta:  Normal size.  No calcifications.  No dissection.   Aortic Valve:  Trileaflet.  No calcifications.   Coronary Arteries:  Normal coronary origin.  Right dominance.   RCA is a dominant  artery that gives rise to PDA and PLA. There is no plaque.   Left main is a large artery that gives rise to LAD and LCX arteries. LM has no disease.   LAD is a large vessel that has no plaque.   LCX is a non-dominant artery that gives rise to three obtuse marginal branches. There is no plaque.   Other findings:   Normal pulmonary vein drainage into the left atrium.   Normal left atrial appendage without a thrombus.   Normal size of the pulmonary artery.   IMPRESSION: 1. Normal coronary calcium score of 0. Patient is low risk for coronary events. 2. Normal coronary origin with right dominance. 3. No evidence of CAD. 4. CAD-RADS 0. Consider non-atherosclerotic causes of chest pain. __________   2D echo 01/17/2021: 1. Left ventricular ejection fraction, by estimation, is 60 to 65%. The  left ventricle has normal function. The left ventricle has no regional  wall motion abnormalities. Left ventricular diastolic parameters were  normal. The average left ventricular  global longitudinal strain is -16.3 %. The global longitudinal strain is  normal.   2. Right ventricular systolic function is normal. The right ventricular  size is normal. Tricuspid regurgitation signal is inadequate for assessing   PA pressure.   3. The aortic valve was not well visualized. Aortic valve regurgitation  is not visualized. No aortic stenosis is present. __________   Limited echo 04/10/2016: - Left ventricle: The cavity size was normal. Systolic function was    normal. The estimated ejection fraction was in the range of 55%    to 60%. Wall motion was normal; there were no regional wall    motion abnormalities. Left ventricular diastolic function    parameters were normal.  - Right ventricle: Systolic function was normal.  - Pulmonary arteries: Systolic pressure could not be accurately    estimated. __________   2D echo 01/08/2016: - Left ventricle: The cavity size was normal. Wall thickness was    normal. Systolic function was mildly to moderately reduced. The    estimated ejection fraction was in the range of 40% to 45%.   Impressions:   - Mild - moderate LV dysfunction       There is still mild hypokinesis of the apical walls but the    contractility has improved since the LV gram performed several    day ago. __________   [facility] 01/06/2016: RPDA lesion, 30 %stenosed. The left ventricular ejection fraction is 25-35% by visual estimate. There is moderate to severe left ventricular systolic dysfunction. LV end diastolic pressure is mildly elevated. There is no mitral valve regurgitation.   1. Mild non-obstructive CAD.  2. The LAD and large diagonal branch have no obstructive disease. The diagonal branch is similar in caliber to the LAD. The LAD does not reach the apex. The Circumflex artery has no obstructive disease. The RCA is large and gives off a small to moderate caliber PDA branch. This branch tapers down to a small caliber vessel distally. Cannot exclude some diffuse plaque in this distal PDA branch but it is too small for PCI.  3. Moderately severe LV systolic dysfunction with segmental hypokinesis of the inferoapical, apical and anteroapical walls c/w Takotsubo's cardiomyopathy.     EKG:  EKG is ordered today.  The EKG ordered today demonstrates NSR, 74 bpm, no acute ST-T changes  Recent Labs: 01/10/2022: ALT 10; Magnesium 1.9 03/10/2022: BUN 13; Creatinine, Ser 0.77; Hemoglobin 13.2; Platelets 223; Potassium 4.1; Sodium 135  Recent  Lipid Panel    Component Value Date/Time   CHOL 150 01/07/2016 0609   TRIG 96 01/07/2016 0609   HDL 32 (L) 01/07/2016 0609   CHOLHDL 4.7 01/07/2016 0609   VLDL 19 01/07/2016 0609   LDLCALC 99 01/07/2016 0609    PHYSICAL EXAM:    VS:  BP (!) 122/94 (BP Location: Left Arm, Patient Position: Sitting, Cuff Size: Large)   Pulse 74   Ht '5\' 9"'$  (1.753 m)   Wt 237 lb 6 oz (107.7 kg)   LMP 10/20/2019   SpO2 98%   BMI 35.05 kg/m   BMI: Body mass index is 35.05 kg/m.  Physical Exam Vitals reviewed.  Constitutional:      Appearance: She is well-developed.  HENT:     Head: Normocephalic and atraumatic.  Eyes:     General:        Right eye: No discharge.        Left eye: No discharge.  Neck:     Vascular: No JVD.  Cardiovascular:     Rate and Rhythm: Normal rate and regular rhythm.     Pulses:          Dorsalis pedis pulses are 1+ on the right side and 1+ on the left side.       Posterior tibial pulses are 1+ on the right side and 1+ on the left side.     Heart sounds: Normal heart sounds, S1 normal and S2 normal. Heart sounds not distant. No midsystolic click and no opening snap. No murmur heard.    No friction rub.  Pulmonary:     Effort: Pulmonary effort is normal. No respiratory distress.     Breath sounds: Normal breath sounds. No decreased breath sounds, wheezing or rales.  Chest:     Chest wall: No tenderness.  Abdominal:     General: There is no distension.  Musculoskeletal:     Cervical back: Normal range of motion.     Right lower leg: No edema.     Left lower leg: No edema.  Skin:    General: Skin is warm and dry.     Nails: There is no clubbing.  Neurological:     Mental Status: She is alert and oriented  to person, place, and time.  Psychiatric:        Speech: Speech normal.        Behavior: Behavior normal.        Thought Content: Thought content normal.        Judgment: Judgment normal.     Wt Readings from Last 3 Encounters:  05/06/22 237 lb 6 oz (107.7 kg)  03/10/22 240 lb 1.3 oz (108.9 kg)  01/10/22 240 lb (108.9 kg)     ASSESSMENT & PLAN:   Nonobstructive CAD: No symptoms concerning for angina.  Continue aggressive risk factor modification and primary prevention including aspirin, atorvastatin, carvedilol, and lisinopril.  LDL 78 in 02/2021 on atorvastatin 40 mg with normal AST/ALT in 01/2022.  No indication for further ischemic testing at this time.  Takotsubo cardiomyopathy: She appears euvolemic and well compensated and is not requiring a standing loop diuretic.  Most recent echo demonstrated normalization of LV systolic function.  She remains on carvedilol and lisinopril.  Right lower extremity pain/claudication: Schedule ABIs and lower extremity arterial ultrasound.  If this is unrevealing, would recommend she contact her PCP for plain film imaging.  HTN: Blood pressure is reasonably controlled in the office today.  Continue current doses of  carvedilol and lisinopril.  Tobacco use: She has tapered her tobacco use to 2 cigarettes/day.  Congratulations were offered.  Complete cessation is encouraged.   Disposition: F/u with Dr. Saunders Revel or an APP in 6 months.   Medication Adjustments/Labs and Tests Ordered: Current medicines are reviewed at length with the patient today.  Concerns regarding medicines are outlined above. Medication changes, Labs and Tests ordered today are summarized above and listed in the Patient Instructions accessible in Encounters.   Signed, Christell Faith, PA-C 05/06/2022 3:23 PM     [facility] [address] [place] [place], [place] [address] [phone]

## 2022-05-06 ENCOUNTER — Encounter: Payer: Self-pay | Admitting: Physician Assistant

## 2022-05-06 ENCOUNTER — Ambulatory Visit: Payer: BC Managed Care – PPO | Attending: Physician Assistant | Admitting: Physician Assistant

## 2022-05-06 VITALS — BP 122/94 | HR 74 | Ht 69.0 in | Wt 237.4 lb

## 2022-05-06 DIAGNOSIS — I5181 Takotsubo syndrome: Secondary | ICD-10-CM

## 2022-05-06 DIAGNOSIS — I2510 Atherosclerotic heart disease of native coronary artery without angina pectoris: Secondary | ICD-10-CM

## 2022-05-06 DIAGNOSIS — Z720 Tobacco use: Secondary | ICD-10-CM | POA: Diagnosis not present

## 2022-05-06 DIAGNOSIS — I739 Peripheral vascular disease, unspecified: Secondary | ICD-10-CM

## 2022-05-06 DIAGNOSIS — I10 Essential (primary) hypertension: Secondary | ICD-10-CM

## 2022-05-06 NOTE — Patient Instructions (Signed)
Medication Instructions:  No changes at this time.   *If you need a refill on your cardiac medications before your next appointment, please call your pharmacy*   Lab Work: None  If you have labs (blood work) drawn today and your tests are completely normal, you will receive your results only by: [facility] (if you have MyChart) OR A paper copy in the mail If you have any lab test that is abnormal or we need to change your treatment, we will call you to review the results.   Testing/Procedures: Your physician has requested that you have an ankle brachial index (ABI). During this test an ultrasound and blood pressure cuff are used to evaluate the arteries that supply the arms and legs with blood. Allow thirty minutes for this exam. There are no restrictions or special instructions.  Your physician has requested that you have a lower extremity arterial exercise duplex. During this test, exercise and ultrasound are used to evaluate arterial blood flow in the legs. Allow one hour for this exam. There are no restrictions or special instructions.     Follow-Up: At [facility], you and your health needs are our priority.  As part of our continuing mission to provide you with exceptional heart care, we have created designated Provider Care Teams.  These Care Teams include your primary Cardiologist (physician) and Advanced Practice Providers (APPs -  Physician Assistants and Nurse Practitioners) who all work together to provide you with the care you need, when you need it.   Your next appointment:   6 month(s)  The format for your next appointment:   In Person  Provider:   Nelva Bush, MD or Christell Faith, PA-C        Important Information About Sugar

## 2022-06-04 ENCOUNTER — Ambulatory Visit: Payer: BC Managed Care – PPO | Attending: Physician Assistant

## 2022-06-15 ENCOUNTER — Ambulatory Visit
Admission: RE | Admit: 2022-06-15 | Discharge: 2022-06-15 | Disposition: A | Discharge status: Home / Self Care | Payer: BC Managed Care – PPO | Source: Ambulatory Visit | Attending: Infectious Diseases | Admitting: Infectious Diseases

## 2022-06-15 DIAGNOSIS — Z1231 Encounter for screening mammogram for malignant neoplasm of breast: Secondary | ICD-10-CM | POA: Insufficient documentation

## 2022-06-24 ENCOUNTER — Other Ambulatory Visit: Payer: Self-pay | Admitting: Infectious Diseases

## 2022-06-24 DIAGNOSIS — N6489 Other specified disorders of breast: Secondary | ICD-10-CM

## 2022-06-24 DIAGNOSIS — R928 Other abnormal and inconclusive findings on diagnostic imaging of breast: Secondary | ICD-10-CM

## 2022-06-26 ENCOUNTER — Ambulatory Visit
Admission: RE | Admit: 2022-06-26 | Discharge: 2022-06-26 | Disposition: A | Discharge status: Home / Self Care | Payer: BC Managed Care – PPO | Source: Ambulatory Visit | Attending: Infectious Diseases | Admitting: Infectious Diseases

## 2022-06-26 DIAGNOSIS — N6489 Other specified disorders of breast: Secondary | ICD-10-CM | POA: Diagnosis present

## 2022-06-26 DIAGNOSIS — R928 Other abnormal and inconclusive findings on diagnostic imaging of breast: Secondary | ICD-10-CM | POA: Insufficient documentation

## 2022-07-30 ENCOUNTER — Ambulatory Visit: Payer: BC Managed Care – PPO | Attending: Physician Assistant

## 2022-07-30 DIAGNOSIS — I739 Peripheral vascular disease, unspecified: Secondary | ICD-10-CM | POA: Diagnosis not present

## 2022-07-30 LAB — VAS US LOWER EXT ART SEG MULTI (SEGMENTALS & LE RAYNAUDS)
Left ABI: 1.19
Right ABI: 1.09

## 2022-08-24 IMAGING — CR DG CHEST 2V
1 series · 2 of 2 positions shown · non-contrast
Comparison: 01/06/2016

CLINICAL DATA: Chest pain

EXAM:
CHEST - 2 VIEW

[Series 1: dg chest 2 view · 0.14mm/px · 2 of 2 slices shown]
[im 1/2]
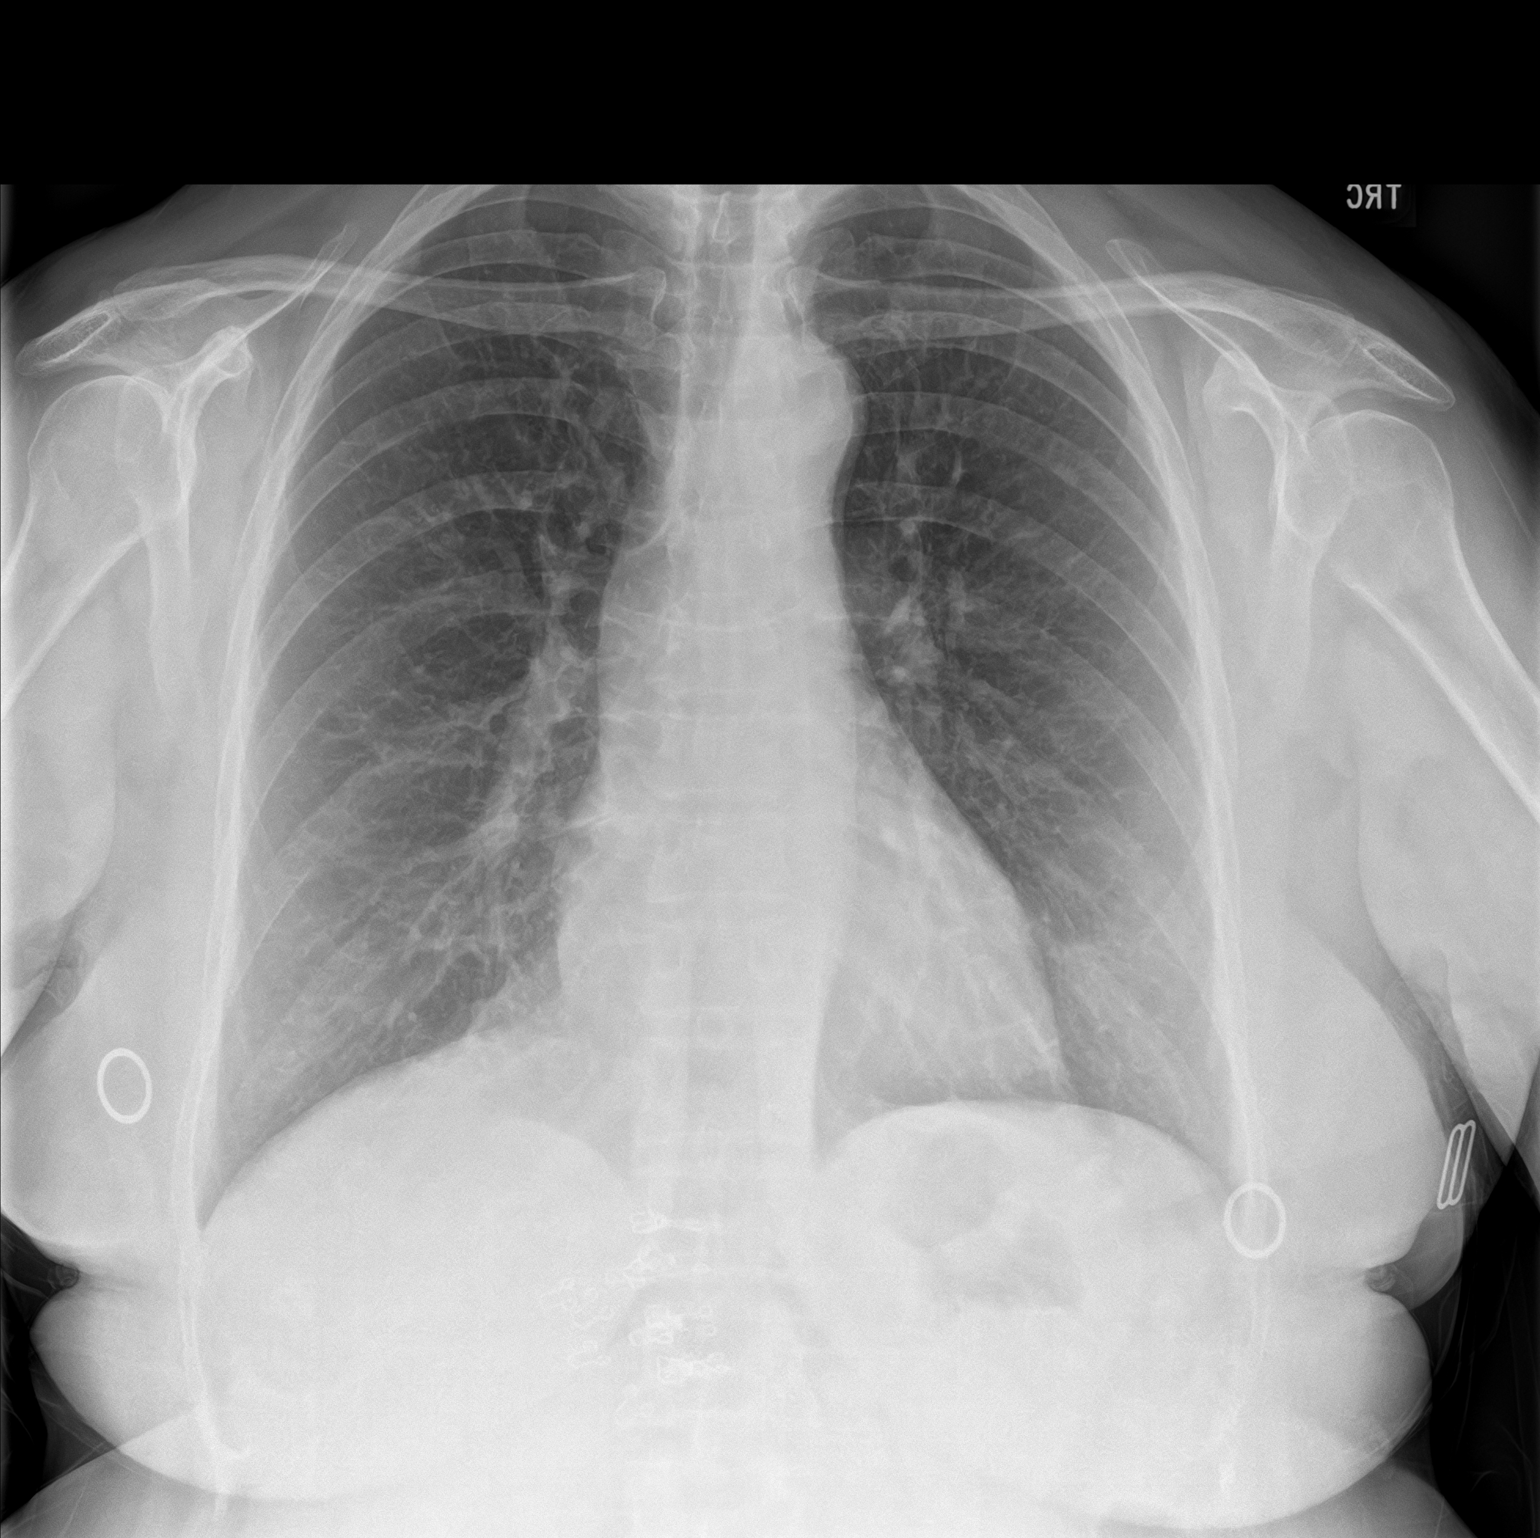
[im 2/2]
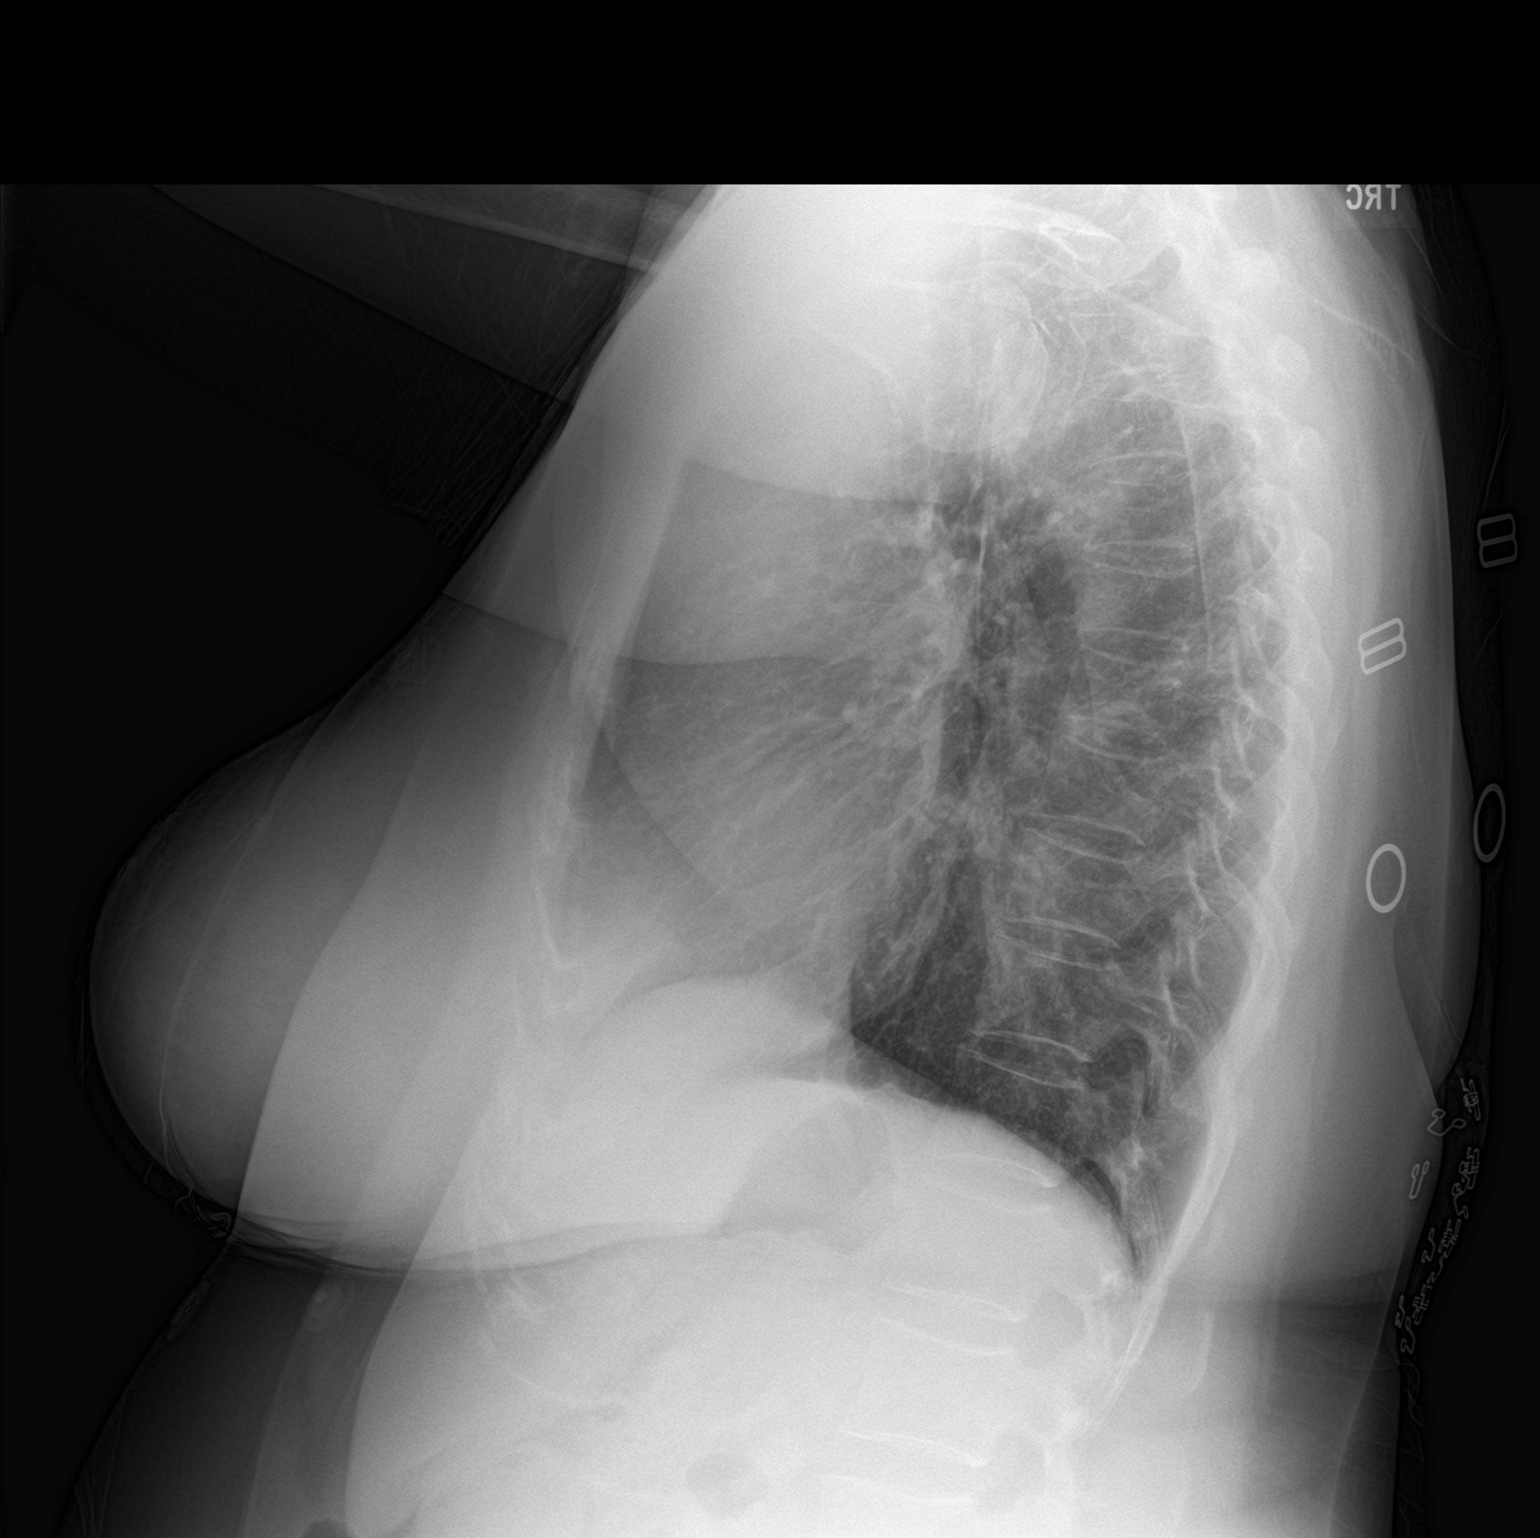

[2 of 2 positions shown; findings below may reference images not displayed]

FINDINGS: The heart size and mediastinal contours are within normal limits.
Both lungs are clear. The visualized skeletal structures are
unremarkable.
IMPRESSION: No active cardiopulmonary disease.

## 2022-11-05 ENCOUNTER — Encounter: Payer: Self-pay | Admitting: Internal Medicine

## 2022-11-05 ENCOUNTER — Other Ambulatory Visit
Admission: RE | Admit: 2022-11-05 | Discharge: 2022-11-05 | Disposition: A | Discharge status: Home / Self Care | Payer: BC Managed Care – PPO | Source: Ambulatory Visit | Attending: Internal Medicine | Admitting: Internal Medicine

## 2022-11-05 ENCOUNTER — Ambulatory Visit: Payer: BC Managed Care – PPO | Attending: Internal Medicine | Admitting: Internal Medicine

## 2022-11-05 VITALS — BP 130/80 | HR 73 | Ht 69.0 in | Wt 240.0 lb

## 2022-11-05 DIAGNOSIS — I10 Essential (primary) hypertension: Secondary | ICD-10-CM

## 2022-11-05 DIAGNOSIS — E785 Hyperlipidemia, unspecified: Secondary | ICD-10-CM

## 2022-11-05 DIAGNOSIS — I5181 Takotsubo syndrome: Secondary | ICD-10-CM

## 2022-11-05 DIAGNOSIS — Z79899 Other long term (current) drug therapy: Secondary | ICD-10-CM

## 2022-11-05 DIAGNOSIS — I2510 Atherosclerotic heart disease of native coronary artery without angina pectoris: Secondary | ICD-10-CM | POA: Diagnosis not present

## 2022-11-05 LAB — COMPREHENSIVE METABOLIC PANEL
ALT: 14 U/L (ref 0–44)
AST: 17 U/L (ref 15–41)
Albumin: 3.8 g/dL (ref 3.5–5.0)
Alkaline Phosphatase: 103 U/L (ref 38–126)
Anion gap: 6 (ref 5–15)
BUN: 10 mg/dL (ref 6–20)
CO2: 25 mmol/L (ref 22–32)
Calcium: 8.7 mg/dL — ABNORMAL LOW (ref 8.9–10.3)
Chloride: 104 mmol/L (ref 98–111)
Creatinine, Ser: 0.76 mg/dL (ref 0.44–1.00)
GFR, Estimated: >60 mL/min (ref >60)
Glucose, Bld: 89 mg/dL (ref 70–99)
Potassium: 4.1 mmol/L (ref 3.5–5.1)
Sodium: 135 mmol/L (ref 135–145)
Total Bilirubin: 0.9 mg/dL (ref 0.3–1.2)
Total Protein: 7.5 g/dL (ref 6.5–8.1)

## 2022-11-05 LAB — CBC
HCT: 41.6 % (ref 36.0–46.0)
Hemoglobin: 13.8 g/dL (ref 12.0–15.0)
MCH: 31.5 pg (ref 26.0–34.0)
MCHC: 33.2 g/dL (ref 30.0–36.0)
MCV: 95 fL (ref 80.0–100.0)
Platelets: 251 10*3/uL (ref 150–400)
RBC: 4.38 MIL/uL (ref 3.87–5.11)
RDW: 13.6 % (ref 11.5–15.5)
WBC: 6.6 10*3/uL (ref 4.0–10.5)
nRBC: 0 % (ref 0.0–0.2)

## 2022-11-05 LAB — LIPID PANEL
Cholesterol: 205 mg/dL — ABNORMAL HIGH (ref 0–200)
HDL: 40 mg/dL — ABNORMAL LOW (ref >40)
LDL Cholesterol: 140 mg/dL — ABNORMAL HIGH (ref 0–99)
Total CHOL/HDL Ratio: 5.1 RATIO
Triglycerides: 126 mg/dL (ref <150)
VLDL: 25 mg/dL (ref 0–40)

## 2022-11-05 MED ORDER — NITROGLYCERIN 0.4 MG SL SUBL: 0.4000 mg | SUBLINGUAL_TABLET | SUBLINGUAL | 3 refills | Status: DC | PRN

## 2022-11-05 NOTE — Patient Instructions (Signed)
Medication Instructions:  Your physician recommends that you continue on your current medications as directed. Please refer to the Current Medication list given to you today.  *If you need a refill on your cardiac medications before your next appointment, please call your pharmacy*   Lab Work: Your provider would like for you to have following labs drawn: (CBC, CMP, Lipid).   Please go to the [facility] entrance and check in at the front desk.  You do not need an appointment.  They are open from 7am-6 pm.   If you have labs (blood work) drawn today and your tests are completely normal, you will receive your results only by: MyChart Message (if you have MyChart) OR A paper copy in the mail If you have any lab test that is abnormal or we need to change your treatment, we will call you to review the results.   Testing/Procedures: None ordered today   Follow-Up: At [facility], you and your health needs are our priority.  As part of our continuing mission to provide you with exceptional heart care, we have created designated Provider Care Teams.  These Care Teams include your primary Cardiologist (physician) and Advanced Practice Providers (APPs -  Physician Assistants and Nurse Practitioners) who all work together to provide you with the care you need, when you need it.  We recommend signing up for the patient portal called "MyChart".  Sign up information is provided on this After Visit Summary.  MyChart is used to connect with patients for Virtual Visits (Telemedicine).  Patients are able to view lab/test results, encounter notes, upcoming appointments, etc.  Non-urgent messages can be sent to your provider as well.   To learn more about what you can do with MyChart, go to [email or web address].    Your next appointment:   1 year(s)  Provider:   You may see Yvonne Kendall, MD or one of the following Advanced Practice Providers on your designated Care Team:    Nicolasa Ducking, NP Eula Listen, PA-C Cadence Fransico Michael, PA-C Charlsie Quest, NP

## 2022-11-05 NOTE — Progress Notes (Signed)
  Cardiology Office Note:  .   Date:  11/05/2022  ID:  Lebron Conners, DOB [birth year], MRN [id number] PCP: Mick Sell, MD  [facility] Providers Cardiologist:  Yvonne Kendall, MD     History of Present Illness: .   Sarah Mills is a 47 y.o. female with history of minimal nonobstructive coronary artery disease, stress-induced cardiomyopathy, menorrhagia with dysmenorrhea, and tobacco use, presenting for follow-up of cardiomyopathy.  She was last seen in our office in 04/2022 by Eula Listen, PA, at which time she was doing well from a heart standpoint.  She reported a several month history of right hip and lower extremity pain/cramping with prolonged ambulation.  She was referred for ABIs, which were normal in both legs.  Today, Sarah Mills reports that she is feeling fairly well though she has been under more stress over the last month.  Her husband was hospitalized in May with atrial fibrillation and NSTEMI and underwent PCI.  About 2 to 3 weeks later, he suddenly developed neurologic deficits while at dinner with her and was diagnosed with an acute stroke.  He still has difficulty speaking as well as hemiparesis and is an inpatient have at [facility].  Sarah Mills herself has not had any chest pain or shortness of breath, palpitations, lightheadedness, edema, or leg pain.  She is not checking her blood pressures at home on a regular basis.  ROS: See HPI  Studies Reviewed: .    EKG (11/05/22): Normal sinus rhythm with poor R-wave progression (suspect lead placement).  No significant change.  ABIs (07/30/22): Normal bilaterally (ABIs 1.09 on the right and 1.19 on the left, TBI's 0.75 on the right and 0.95 on the left).  Risk Assessment/Calculations:             Physical Exam:   VS:  BP 130/80 (BP Location: Left Arm, Patient Position: Sitting, Cuff Size: Large)   Pulse 73   Ht 5\' 9"  (1.753 m)   Wt 240 lb (108.9 kg)   LMP 10/20/2019   SpO2 98%   BMI 35.44 kg/m     Wt Readings from Last 3 Encounters:  11/05/22 240 lb (108.9 kg)  05/06/22 237 lb 6 oz (107.7 kg)  03/10/22 240 lb 1.3 oz (108.9 kg)    GEN: Well nourished, well developed in no acute distress NECK: No JVD; No carotid bruits CARDIAC: RRR, no murmurs, rubs, gallops RESPIRATORY:  Clear to auscultation without rales, wheezing or rhonchi  ABDOMEN: Soft, non-tender, non-distended EXTREMITIES:  No edema; No deformity   ASSESSMENT AND PLAN: .   Stress-induced cardiomyopathy: No symptoms today despite recent stress from husband's stroke.  Sarah Mills appears euvolemic with NYHA class I symptoms.  Continue current regimen of carvedilol and lisinopril.  Coronary artery disease and hyperlipidemia: No angina reported.  Cath in 2017 showed mild disease in RPDA.  Subsequent coronary CTA without significant disease.  We will continue aspirin and atorvastatin.  Check CBC, CMP, and lipid panel today.  Refill SL NTG today.  Hypertension: BP upper normal today, typically better.  Stress surrounding husband's recent stroke may be contributing.  Continue current doses of carvedilol and lisinopril.  Check CMP today.    Dispo: RTC 1 year.  Signed, Yvonne Kendall, MD

## 2022-11-09 ENCOUNTER — Other Ambulatory Visit: Payer: Self-pay | Admitting: *Deleted

## 2022-11-09 DIAGNOSIS — E785 Hyperlipidemia, unspecified: Secondary | ICD-10-CM

## 2022-11-09 DIAGNOSIS — I739 Peripheral vascular disease, unspecified: Secondary | ICD-10-CM

## 2022-11-09 DIAGNOSIS — Z79899 Other long term (current) drug therapy: Secondary | ICD-10-CM

## 2022-11-09 DIAGNOSIS — I2510 Atherosclerotic heart disease of native coronary artery without angina pectoris: Secondary | ICD-10-CM

## 2022-11-09 MED ORDER — ATORVASTATIN CALCIUM 80 MG PO TABS: 80.0000 mg | ORAL_TABLET | Freq: Every day | ORAL | 3 refills | Status: DC

## 2022-12-30 ENCOUNTER — Other Ambulatory Visit: Payer: Self-pay | Admitting: Physician Assistant

## 2023-01-31 ENCOUNTER — Other Ambulatory Visit: Payer: Self-pay | Admitting: Internal Medicine

## 2023-02-15 ENCOUNTER — Telehealth: Payer: Self-pay | Admitting: *Deleted

## 2023-02-15 DIAGNOSIS — I2510 Atherosclerotic heart disease of native coronary artery without angina pectoris: Secondary | ICD-10-CM

## 2023-02-15 DIAGNOSIS — Z79899 Other long term (current) drug therapy: Secondary | ICD-10-CM

## 2023-02-15 DIAGNOSIS — E785 Hyperlipidemia, unspecified: Secondary | ICD-10-CM

## 2023-02-15 NOTE — Telephone Encounter (Signed)
-----   Message from Nurse Rinaldo Cloud A sent at 11/09/2022 10:17 AM EDT ----- Regarding: Labs October 2024 Pt needs fasting lipid & liver panel due in 3 months which is around October.

## 2023-02-15 NOTE — Telephone Encounter (Signed)
Spoke with patient and reviewed need for repeat fasting labs to be done. Advised that we are able to do those here in the office. She verbalized understanding and lab orders have been placed.

## 2023-12-25 ENCOUNTER — Other Ambulatory Visit: Payer: Self-pay

## 2023-12-25 ENCOUNTER — Emergency Department
Admission: EM | Admit: 2023-12-25 | Discharge: 2023-12-25 | Disposition: A | Discharge status: Home / Self Care | Attending: Emergency Medicine | Admitting: Emergency Medicine

## 2023-12-25 ENCOUNTER — Emergency Department

## 2023-12-25 DIAGNOSIS — W1840XA Slipping, tripping and stumbling without falling, unspecified, initial encounter: Secondary | ICD-10-CM | POA: Diagnosis not present

## 2023-12-25 DIAGNOSIS — S99922A Unspecified injury of left foot, initial encounter: Secondary | ICD-10-CM | POA: Diagnosis present

## 2023-12-25 DIAGNOSIS — Z7982 Long term (current) use of aspirin: Secondary | ICD-10-CM | POA: Insufficient documentation

## 2023-12-25 NOTE — ED Triage Notes (Signed)
 Pt tripped over a a gravestone and hit the top of her foot. Edema noted to top of L foot. Pt reports pain at weight bearing. Able to move toe, cap refill <3. GCS 15. Ice applied

## 2023-12-25 NOTE — ED Provider Notes (Signed)
 [place] EMERGENCY DEPARTMENT AT [facility] Provider Note   CSN: [id number] Arrival date & time: 12/25/23  1016     Patient presents with: Foot Injury   Sarah Mills is a 48 y.o. female.   Patient here for foot injury.  Sustained left foot injury last night.  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  Now has pain top of foot worse with ambulation.  No numbness or weakness.   Foot Injury      Prior to Admission medications   Medication Sig Start Date End Date Taking? Authorizing Provider  acetaminophen  (TYLENOL ) 500 MG tablet Take 1,000 mg by mouth every 6 (six) hours as needed for moderate pain.     [provider]  aspirin  EC 81 MG tablet Take 1 tablet (81 mg total) by mouth daily. 11/16/19   Leonce Garnette JONETTA, MD  atorvastatin  (LIPITOR) 80 MG tablet Take 1 tablet (80 mg total) by mouth daily. 11/09/22 02/07/23  End, Lonni, MD  carvedilol  (COREG ) 6.25 MG tablet TAKE 1 TABLET(6.25 MG) BY MOUTH TWICE DAILY WITH A MEAL 04/06/22   Furth, Cadence H, PA-C  levothyroxine (SYNTHROID) 50 MCG tablet Take 50 mcg by mouth daily. 10/09/21   [provider]  lisinopril  (ZESTRIL ) 10 MG tablet TAKE 1 TABLET(10 MG) BY MOUTH DAILY 12/30/22   Dunn, Bernardino HERO, PA-C  nitroGLYCERIN  (NITROSTAT ) 0.4 MG SL tablet Place 1 tablet (0.4 mg total) under the tongue every 5 (five) minutes as needed for chest pain. 11/05/22 11/05/23  End, Lonni, MD    Allergies: Patient has no known allergies.    Review of Systems  Musculoskeletal:  Positive for arthralgias.  Neurological:  Negative for weakness and numbness.  All other systems reviewed and are negative.   Updated Vital Signs BP (!) 149/72   Pulse 87   Temp 98.5 F (36.9 C) (Oral)   Resp 18   Ht 5' 9 (1.753 m)   Wt 95.3 kg   LMP 10/20/2019   SpO2 99%   BMI 31.01 kg/m   Physical Exam Vitals reviewed.  Constitutional:      Appearance: Normal appearance.  HENT:     Head:  Normocephalic and atraumatic.     Nose: Nose normal.  Cardiovascular:     Pulses: Normal pulses.  Pulmonary:     Effort: Pulmonary effort is normal.  Musculoskeletal:     Cervical back: Normal range of motion.     Comments: Moderate tenderness dorsum of left foot.  Dorsal pedal pulse intact.  Capillary refill less than 2 seconds.  No ankle joint bony tenderness.  Full range of motion ankle.  Neurological:     Mental Status: She is alert and oriented to person, place, and time. Mental status is at baseline.  Psychiatric:        Mood and Affect: Mood normal.        Behavior: Behavior normal.     (all labs ordered are listed, but only abnormal results are displayed) Labs Reviewed - No data to display  EKG: None  Radiology: DG Foot Complete Left Result Date: 12/25/2023 CLINICAL DATA:  Foot injury EXAM: LEFT FOOT - COMPLETE 3+ VIEW COMPARISON:  None Available. FINDINGS: No fracture or dislocation of mid foot or forefoot. The phalanges are normal. The calcaneus is normal. No soft tissue abnormality. IMPRESSION: No fracture or dislocation. Electronically Signed   By: Jackquline Boxer M.D.   On: 12/25/2023 11:24     Procedures  Medications Ordered in the ED - No data to display                                  Medical Decision Making Patient here for left foot injury last night.  Neurovasculature is intact.  Does have pain dorsum of foot.  No ankle pain or tenderness.  No concern for foot fracture less likely dislocation.  Concern for Lisfranc.  X-ray is unremarkable however we will place in walking boot and refer to orthopedics.  Patient verbalized understanding.  Given return precautions.  Amount and/or Complexity of Data Reviewed Radiology: ordered.        Final diagnoses:  Injury of left foot, initial encounter    ED Discharge Orders     None          [place] Mallick, [place] 12/25/23 1141    Suzanne Kirsch, MD 12/25/23 1606

## 2023-12-25 NOTE — Discharge Instructions (Addendum)
 Recommend rest ice and elevation.  Take Tylenol  650 mg 4 times daily.  You may also take ibuprofen  600 mg 3 times daily with food.  Follow-up with orthopedics for further evaluation.  Return here for new or worse symptoms including numbness and weakness.

## 2023-12-25 NOTE — ED Notes (Signed)
 Dc instructions reviewed no questions or concerns at this time. Will follow up. Pt transported out via wheelchair by family

## 2024-02-01 ENCOUNTER — Ambulatory Visit: Attending: Physician Assistant | Admitting: Physician Assistant

## 2024-02-01 ENCOUNTER — Encounter: Payer: Self-pay | Admitting: Physician Assistant

## 2024-02-01 VITALS — BP 130/90 | HR 81 | Ht 69.0 in | Wt 235.8 lb

## 2024-02-01 DIAGNOSIS — I2510 Atherosclerotic heart disease of native coronary artery without angina pectoris: Secondary | ICD-10-CM

## 2024-02-01 DIAGNOSIS — I5181 Takotsubo syndrome: Secondary | ICD-10-CM | POA: Diagnosis not present

## 2024-02-01 DIAGNOSIS — I10 Essential (primary) hypertension: Secondary | ICD-10-CM | POA: Diagnosis not present

## 2024-02-01 DIAGNOSIS — E785 Hyperlipidemia, unspecified: Secondary | ICD-10-CM

## 2024-02-01 DIAGNOSIS — Z79899 Other long term (current) drug therapy: Secondary | ICD-10-CM

## 2024-02-01 DIAGNOSIS — Z720 Tobacco use: Secondary | ICD-10-CM

## 2024-02-01 MED ORDER — ATORVASTATIN CALCIUM 80 MG PO TABS: 80.0000 mg | ORAL_TABLET | Freq: Every day | ORAL | 3 refills | Status: AC

## 2024-02-01 NOTE — Progress Notes (Signed)
 Cardiology Office Note    Date:  02/01/2024   ID:  Sarah Mills, DOB 1975-10-06, MRN [id number]  PCP:  Epifanio Alm SQUIBB, MD  Cardiologist:  Lonni Hanson, MD  Electrophysiologist:  None   Chief Complaint: Follow-up  History of Present Illness:   Sarah Mills is a 48 y.o. female with history of minimal nonobstructive CAD, Takotsubo cardiomyopathy, menorrhagia with dysmenorrhea, and tobacco use who presents for follow-up of her cardiomyopathy.   Her cardiac history dates back to 12/2015, at which time she was admitted following sudden onset of chest pressure associated with dyspnea and diaphoresis.  Initial troponin was elevated at 0.89 and EKG was notable for new T wave inversion in leads V3 through V5.  She was admitted for management of NSTEMI with diagnostic LHC revealing minimal plaque in the rPDA with otherwise normal coronary arteries.  EF was 25 to 35% by LV-gram and 40 to 45% by echo.  Medical therapy was initiated.  Follow-up echo in 04/2016 demonstrated an improvement in her LV systolic function with an EF of 55 to 60% without regional wall motion abnormalities.  Financial constraints have previously limited medical therapy briefly.  Coronary CTA in 01/2021 demonstrated a calcium  score of 0 with no evidence of CAD.  Echo in 01/2021 showed an EF of 60 to 65%, no regional wall motion abnormalities, normal LV diastolic function parameters, normal RV systolic function and ventricular cavity size, and no significant valvular abnormalities.  ABI normal bilaterally in 07/2022.  She was last seen in the office in 10/2022 and was under increased stress surrounding the health of her husband.  She was doing well from a cardiac perspective.  No changes were indicated at that time.  She comes in doing well from a cardiac perspective and is without symptoms of angina or cardiac decompensation.  Since she was last seen she did have 1 mechanical fall, no LOC.  No dizziness, presyncope, or  syncope.  Since she was last seen her husband did pass away in 02/2023.  She is in good spirits and has a good support system.  Following a heart healthy diet and exercising/walking on a regular basis.  In this setting, weight is down 5 pounds today when compared to last clinic visit.  Blood pressure typically well-controlled at home.  She has also decreased her tobacco use, rarely smoking, typically when stressed.  Reports adherence to cardiac pharmacotherapy.  Overall feels well and does not have any acute cardiac concerns at this time.   Labs independently reviewed: 10/2022 - Hgb 13.8, PLT 251, potassium 4.1, BUN 10, serum creatinine 0.76, BNP 318, AST/ALT normal, TC 205, TG 126, HDL 40, LDL 140 01/2022 - magnesium 1.9 12/2021 - TSH normal 02/2021 - A1c 5.6  Past Medical History:  Diagnosis Date   Dysmenorrhea    Headache    Hypertension    IBS (irritable bowel syndrome)    Menorrhagia    NSTEMI (non-ST elevated myocardial infarction) (HCC)    a. 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   Obesity    Takotsubo cardiomyopathy    a. 12/2015 Cath: Nl cors - ? plaque in small RPDA.  EF 25-35%; b. 12/2015 Echo: EF 40-45%; c. 04/2016 Echo: EF 55-60%, no rwma. Nl RV fxn.   Tobacco abuse     Past Surgical History:  Procedure Laterality Date   ABDOMINAL HYSTERECTOMY     CARDIAC CATHETERIZATION N/A 01/06/2016   Procedure: Left Heart Cath and Coronary Angiography;  Surgeon: Lonni  JONETTA Cash, MD;  Location: [facility];  Service: Cardiovascular;  Laterality: N/A;   CYSTOSCOPY N/A 11/16/2019   Procedure: CYSTOSCOPY;  Surgeon: Leonce Garnette JONETTA, MD;  Location: [facility];  Service: Gynecology;  Laterality: N/A;   TOTAL LAPAROSCOPIC HYSTERECTOMY WITH SALPINGECTOMY Bilateral 11/16/2019   Procedure: TOTAL LAPAROSCOPIC HYSTERECTOMY WITH BILATERIAL SALPINGECTOMY;  Surgeon: Leonce Garnette JONETTA, MD;  Location: [facility];  Service: Gynecology;  Laterality: Bilateral;   TUBAL  LIGATION      Current Medications: Current Meds  Medication Sig   acetaminophen  (TYLENOL ) 500 MG tablet Take 1,000 mg by mouth every 6 (six) hours as needed for moderate pain.    aspirin  EC 81 MG tablet Take 1 tablet (81 mg total) by mouth daily.   carvedilol  (COREG ) 6.25 MG tablet TAKE 1 TABLET(6.25 MG) BY MOUTH TWICE DAILY WITH A MEAL   levothyroxine (SYNTHROID) 50 MCG tablet Take 50 mcg by mouth daily.   lisinopril  (ZESTRIL ) 10 MG tablet TAKE 1 TABLET(10 MG) BY MOUTH DAILY   [DISCONTINUED] atorvastatin  (LIPITOR) 80 MG tablet Take 1 tablet (80 mg total) by mouth daily.    Allergies:   Patient has no known allergies.   Social History   Socioeconomic History   Marital status: Married    Spouse name: Not on file   Number of children: Not on file   Years of education: Not on file   Highest education level: Not on file  Occupational History   Not on file  Tobacco Use   Smoking status: Some Days    Current packs/day: 0.25    Average packs/day: 0.3 packs/day for 28.0 years (7.0 ttl pk-yrs)    Types: Cigarettes   Smokeless tobacco: Never   Tobacco comments:    Smokes 2 cigarettes daily   Vaping Use   Vaping status: Never Used  Substance and Sexual Activity   Alcohol use: No   Drug use: No   Sexual activity: Not Currently    Birth control/protection: None  Other Topics Concern   Not on file  Social History Narrative   Lives in [place] w/ son, dtr, and grandchild.  Works @ a Aud[employer].  Active @ work and goes for walks with her granddtr regularly.   Social Drivers of Corporate investment banker Strain: Not on file  Food Insecurity: Not on file  Transportation Needs: Not on file  Physical Activity: Not on file  Stress: Not on file  Social Connections: Not on file     Family History:  The patient's family history includes Breast cancer in her maternal grandmother and paternal grandmother; CAD in her father and maternal grandfather; Heart attack (age of onset: 15) in her  father; Heart disease in her father.  ROS:   12-point review of systems is negative unless otherwise noted in the HPI.   EKGs/Labs/Other Studies Reviewed:    Studies reviewed were summarized above. The additional studies were reviewed today:  Coronary CTA 01/23/2021: FINDINGS: Aorta:  Normal size.  No calcifications.  No dissection.   Aortic Valve:  Trileaflet.  No calcifications.   Coronary Arteries:  Normal coronary origin.  Right dominance.   RCA is a dominant artery that gives rise to PDA and PLA. There is no plaque.   Left main is a large artery that gives rise to LAD and LCX arteries. LM has no disease.   LAD is a large vessel that has no plaque.   LCX is a non-dominant artery that gives rise to three obtuse marginal  branches. There is no plaque.   Other findings:   Normal pulmonary vein drainage into the left atrium.   Normal left atrial appendage without a thrombus.   Normal size of the pulmonary artery.   IMPRESSION: 1. Normal coronary calcium  score of 0. Patient is low risk for coronary events. 2. Normal coronary origin with right dominance. 3. No evidence of CAD. 4. CAD-RADS 0. Consider non-atherosclerotic causes of chest pain. __________   2D echo 01/17/2021: 1. Left ventricular ejection fraction, by estimation, is 60 to 65%. The  left ventricle has normal function. The left ventricle has no regional  wall motion abnormalities. Left ventricular diastolic parameters were  normal. The average left ventricular  global longitudinal strain is -16.3 %. The global longitudinal strain is  normal.   2. Right ventricular systolic function is normal. The right ventricular  size is normal. Tricuspid regurgitation signal is inadequate for assessing  PA pressure.   3. The aortic valve was not well visualized. Aortic valve regurgitation  is not visualized. No aortic stenosis is present. __________   Limited echo 04/10/2016: - Left ventricle: The cavity size was  normal. Systolic function was    normal. The estimated ejection fraction was in the range of 55%    to 60%. Wall motion was normal; there were no regional wall    motion abnormalities. Left ventricular diastolic function    parameters were normal.  - Right ventricle: Systolic function was normal.  - Pulmonary arteries: Systolic pressure could not be accurately    estimated. __________   2D echo 01/08/2016: - Left ventricle: The cavity size was normal. Wall thickness was    normal. Systolic function was mildly to moderately reduced. The    estimated ejection fraction was in the range of 40% to 45%.   Impressions:   - Mild - moderate LV dysfunction       There is still mild hypokinesis of the apical walls but the    contractility has improved since the LV gram performed several    day ago. __________   [facility] 01/06/2016: RPDA lesion, 30 %stenosed. The left ventricular ejection fraction is 25-35% by visual estimate. There is moderate to severe left ventricular systolic dysfunction. LV end diastolic pressure is mildly elevated. There is no mitral valve regurgitation.   1. Mild non-obstructive CAD.  2. The LAD and large diagonal branch have no obstructive disease. The diagonal branch is similar in caliber to the LAD. The LAD does not reach the apex. The Circumflex artery has no obstructive disease. The RCA is large and gives off a small to moderate caliber PDA branch. This branch tapers down to a small caliber vessel distally. Cannot exclude some diffuse plaque in this distal PDA branch but it is too small for PCI.  3. Moderately severe LV systolic dysfunction with segmental hypokinesis of the inferoapical, apical and anteroapical walls c/w Takotsubo's cardiomyopathy.    EKG:  EKG is ordered today.  The EKG ordered today demonstrates NSR, 81 bpm, possible prior anterior infarct, no acute ST-T changes, consistent with prior tracing  Recent Labs: No results found for requested labs within  last 365 days.  Recent Lipid Panel    Component Value Date/Time   CHOL 205 (H) 11/05/2022 0843   TRIG 126 11/05/2022 0843   HDL 40 (L) 11/05/2022 0843   CHOLHDL 5.1 11/05/2022 0843   VLDL 25 11/05/2022 0843   LDLCALC 140 (H) 11/05/2022 0843    PHYSICAL EXAM:    VS:  BP ROLLEN)  130/90 (BP Location: Left Arm, Patient Position: Sitting, Cuff Size: Large)   Pulse 81   Ht 5' 9 (1.753 m)   Wt 235 lb 12.8 oz (107 kg)   LMP 10/20/2019   SpO2 100%   BMI 34.82 kg/m   BMI: Body mass index is 34.82 kg/m.  Physical Exam Vitals reviewed.  Constitutional:      Appearance: She is well-developed.  HENT:     Head: Normocephalic and atraumatic.  Eyes:     General:        Right eye: No discharge.        Left eye: No discharge.  Cardiovascular:     Rate and Rhythm: Normal rate and regular rhythm.     Pulses:          Posterior tibial pulses are 2+ on the right side and 2+ on the left side.     Heart sounds: Normal heart sounds, S1 normal and S2 normal. Heart sounds not distant. No midsystolic click and no opening snap. No murmur heard.    No friction rub.  Pulmonary:     Effort: Pulmonary effort is normal. No respiratory distress.     Breath sounds: Normal breath sounds. No decreased breath sounds, wheezing, rhonchi or rales.  Musculoskeletal:     Cervical back: Normal range of motion.     Right lower leg: No edema.     Left lower leg: No edema.  Skin:    General: Skin is warm and dry.     Nails: There is no clubbing.  Neurological:     Mental Status: She is alert and oriented to person, place, and time.  Psychiatric:        Speech: Speech normal.        Behavior: Behavior normal.        Thought Content: Thought content normal.        Judgment: Judgment normal.     Wt Readings from Last 3 Encounters:  02/01/24 235 lb 12.8 oz (107 kg)  12/25/23 210 lb (95.3 kg)  11/05/22 240 lb (108.9 kg)     ASSESSMENT & PLAN:   Nonobstructive CAD: She is doing well and without symptoms  concerning for angina.  Continue aggressive risk factor modification and primary prevention including aspirin  81 mg and atorvastatin  80 mg.  Check a CMP, lipid panel, and direct LDL.  No indication for further ischemic testing at this time.  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  Continue carvedilol  6.25 mg twice daily and lisinopril  10 mg.  Not requiring a standing loop diuretic.  Given normalization of LV systolic function, and in the context of lack of heart failure symptoms, defer further escalation of GDMT at this time.  Check renal function.  HTN: Blood pressure is mildly elevated in the office though typically well-controlled at home.  She remains on carvedilol  and lisinopril  as outlined above.  Tobacco use: Has significantly decreased tobacco use, now smoking only when stressed.  Congratulations were offered.  Complete cessation is encouraged.    Disposition: F/u with Dr. Mady or an APP in 12 months.   Medication Adjustments/Labs and Tests Ordered: Current medicines are reviewed at length with the patient today.  Concerns regarding medicines are outlined above. Medication changes, Labs and Tests ordered today are summarized above and listed in the Patient Instructions accessible in Encounters.   SignedBernardino Bring, PA-C 02/01/2024 12:54 PM     [facility] [address] [place], [place] [address] [phone]

## 2024-02-01 NOTE — Patient Instructions (Signed)
 Medication Instructions:  Your physician recommends that you continue on your current medications as directed. Please refer to the Current Medication list given to you today.   *If you need a refill on your cardiac medications before your next appointment, please call your pharmacy*  Lab Work: Your provider would like for you to have following labs drawn today Lipid, CMP, and Direct LDL.   If you have labs (blood work) drawn today and your tests are completely normal, you will receive your results only by: MyChart Message (if you have MyChart) OR A paper copy in the mail If you have any lab test that is abnormal or we need to change your treatment, we will call you to review the results.  Follow-Up: At [facility], you and your health needs are our priority.  As part of our continuing mission to provide you with exceptional heart care, our providers are all part of one team.  This team includes your primary Cardiologist (physician) and Advanced Practice Providers or APPs (Physician Assistants and Nurse Practitioners) who all work together to provide you with the care you need, when you need it.  Your next appointment:   12 month(s)  Provider:   You may see Bernardino Bring, PA-C  We recommend signing up for the patient portal called MyChart.  Sign up information is provided on this After Visit Summary.  MyChart is used to connect with patients for Virtual Visits (Telemedicine).  Patients are able to view lab/test results, encounter notes, upcoming appointments, etc.  Non-urgent messages can be sent to your provider as well.   To learn more about what you can do with MyChart, go to [email or web address].

## 2024-02-02 ENCOUNTER — Ambulatory Visit: Payer: Self-pay | Admitting: Physician Assistant

## 2024-02-02 LAB — COMPREHENSIVE METABOLIC PANEL WITH GFR
ALT: 16 IU/L (ref 0–32)
AST: 17 IU/L (ref 0–40)
Albumin: 4.3 g/dL (ref 3.9–4.9)
Alkaline Phosphatase: 135 IU/L — ABNORMAL HIGH (ref 41–116)
BUN/Creatinine Ratio: 18 (ref 9–23)
BUN: 16 mg/dL (ref 6–24)
Bilirubin Total: 0.3 mg/dL (ref 0.0–1.2)
CO2: 23 mmol/L (ref 20–29)
Calcium: 9.3 mg/dL (ref 8.7–10.2)
Chloride: 102 mmol/L (ref 96–106)
Creatinine, Ser: 0.89 mg/dL (ref 0.57–1.00)
Globulin, Total: 2.9 g/dL (ref 1.5–4.5)
Glucose: 81 mg/dL (ref 70–99)
Potassium: 4.6 mmol/L (ref 3.5–5.2)
Sodium: 141 mmol/L (ref 134–144)
Total Protein: 7.2 g/dL (ref 6.0–8.5)
eGFR: 79 mL/min/1.73 (ref >59)

## 2024-02-02 LAB — LIPID PANEL
Chol/HDL Ratio: 4.2 ratio (ref 0.0–4.4)
Cholesterol, Total: 174 mg/dL (ref 100–199)
HDL: 41 mg/dL (ref >39)
LDL Chol Calc (NIH): 110 mg/dL — ABNORMAL HIGH (ref 0–99)
Triglycerides: 130 mg/dL (ref 0–149)
VLDL Cholesterol Cal: 23 mg/dL (ref 5–40)

## 2024-02-02 LAB — LDL CHOLESTEROL, DIRECT: LDL Direct: 113 mg/dL — ABNORMAL HIGH (ref 0–99)

## 2024-02-17 DIAGNOSIS — Z23 Encounter for immunization: Secondary | ICD-10-CM | POA: Diagnosis not present
